# Patient Record
Sex: Male | Born: 1955 | Race: White | Hispanic: No | Marital: Married | Smoking: Current every day smoker
Health system: Southern US, Community
[De-identification: ages and names within clinical notes are randomized; demographics above are authoritative.]

## PROBLEM LIST (undated history)

## (undated) DIAGNOSIS — K219 Gastro-esophageal reflux disease without esophagitis: Secondary | ICD-10-CM

## (undated) DIAGNOSIS — I10 Essential (primary) hypertension: Secondary | ICD-10-CM

## (undated) HISTORY — DX: Essential (primary) hypertension: I10

## (undated) HISTORY — DX: Gastro-esophageal reflux disease without esophagitis: K21.9

## (undated) HISTORY — PX: APPENDECTOMY: SHX54

---

## 2010-09-06 ENCOUNTER — Ambulatory Visit: Payer: Self-pay | Admitting: Ophthalmology

## 2010-09-30 ENCOUNTER — Ambulatory Visit: Payer: Self-pay | Admitting: Ophthalmology

## 2013-12-09 LAB — PSA: PSA: 1

## 2014-07-28 LAB — BASIC METABOLIC PANEL
CREATININE: 0.9 mg/dL (ref <=1.3)
GLUCOSE: 97 mg/dL

## 2014-07-28 LAB — LIPID PANEL
CHOLESTEROL: 158 mg/dL (ref 0–200)
HDL: 37 mg/dL (ref 35–70)
LDL Cholesterol: 93 mg/dL
TRIGLYCERIDES: 141 mg/dL (ref 40–160)

## 2014-08-09 DIAGNOSIS — N4289 Other specified disorders of prostate: Secondary | ICD-10-CM | POA: Insufficient documentation

## 2014-08-09 DIAGNOSIS — M436 Torticollis: Secondary | ICD-10-CM | POA: Insufficient documentation

## 2014-08-09 DIAGNOSIS — K219 Gastro-esophageal reflux disease without esophagitis: Secondary | ICD-10-CM | POA: Insufficient documentation

## 2014-08-09 DIAGNOSIS — R030 Elevated blood-pressure reading, without diagnosis of hypertension: Secondary | ICD-10-CM | POA: Insufficient documentation

## 2014-08-09 DIAGNOSIS — Z0000 Encounter for general adult medical examination without abnormal findings: Secondary | ICD-10-CM | POA: Insufficient documentation

## 2015-02-12 ENCOUNTER — Other Ambulatory Visit: Payer: Self-pay | Admitting: Family Medicine

## 2015-05-18 ENCOUNTER — Other Ambulatory Visit: Payer: Self-pay

## 2015-05-19 ENCOUNTER — Other Ambulatory Visit: Payer: Self-pay | Admitting: Family Medicine

## 2015-05-25 ENCOUNTER — Ambulatory Visit (INDEPENDENT_AMBULATORY_CARE_PROVIDER_SITE_OTHER): Payer: BLUE CROSS/BLUE SHIELD | Admitting: Family Medicine

## 2015-05-25 ENCOUNTER — Encounter: Payer: Self-pay | Admitting: Family Medicine

## 2015-05-25 VITALS — BP 130/64 | HR 80 | Ht 71.0 in | Wt 166.0 lb

## 2015-05-25 DIAGNOSIS — Z1211 Encounter for screening for malignant neoplasm of colon: Secondary | ICD-10-CM

## 2015-05-25 DIAGNOSIS — K219 Gastro-esophageal reflux disease without esophagitis: Secondary | ICD-10-CM | POA: Diagnosis not present

## 2015-05-25 DIAGNOSIS — Z1159 Encounter for screening for other viral diseases: Secondary | ICD-10-CM | POA: Diagnosis not present

## 2015-05-25 DIAGNOSIS — R030 Elevated blood-pressure reading, without diagnosis of hypertension: Secondary | ICD-10-CM

## 2015-05-25 MED ORDER — LISINOPRIL-HYDROCHLOROTHIAZIDE 10-12.5 MG PO TABS: 1.0000 | ORAL_TABLET | Freq: Every day | ORAL | Status: DC

## 2015-05-25 MED ORDER — OMEPRAZOLE 20 MG PO CPDR: 20.0000 mg | DELAYED_RELEASE_CAPSULE | Freq: Every day | ORAL | Status: DC

## 2015-05-25 NOTE — Progress Notes (Signed)
Name: Bruce Kelley   MRN: [id number]    DOB: July 11, 1955   Date:05/25/2015       Progress Note  Subjective  Chief Complaint  Chief Complaint  Patient presents with  . Hypertension  . Gastroesophageal Reflux    Hypertension This is a chronic problem. The current episode started more than 1 year ago. The problem has been gradually improving since onset. The problem is uncontrolled. Pertinent negatives include no anxiety, blurred vision, chest pain, headaches, malaise/fatigue, neck pain, orthopnea, palpitations, peripheral edema, PND, shortness of breath or sweats. There are no associated agents to hypertension. Risk factors for coronary artery disease include smoking/tobacco exposure and male gender. Past treatments include ACE inhibitors and diuretics. The current treatment provides no improvement. There are no compliance problems.  There is no history of angina, kidney disease, CAD/MI, CVA, heart failure, left ventricular hypertrophy, PVD, renovascular disease or retinopathy. There is no history of chronic renal disease or a hypertension causing med.  Gastroesophageal Reflux He reports no abdominal pain, no chest pain, no coughing, no heartburn, no nausea, no sore throat or no wheezing. Pertinent negatives include no melena or weight loss.    No problem-specific assessment & plan notes found for this encounter.   Past Medical History  Diagnosis Date  . Hypertension   . GERD (gastroesophageal reflux disease)     Past Surgical History  Procedure Laterality Date  . Appendectomy      History reviewed. No pertinent family history.  Social History   Social History  . Marital Status: Married    Spouse Name: N/A  . Number of Children: N/A  . Years of Education: N/A   Occupational History  . Not on file.   Social History Main Topics  . Smoking status: Current Every Day Smoker  . Smokeless tobacco: Not on file  . Alcohol Use: 0.0 oz/week    0 Standard drinks or equivalent  per week  . Drug Use: No  . Sexual Activity: Yes   Other Topics Concern  . Not on file   Social History Narrative    No Known Allergies   Review of Systems  Constitutional: Negative for fever, chills, weight loss and malaise/fatigue.  HENT: Negative for ear discharge, ear pain and sore throat.   Eyes: Negative for blurred vision.  Respiratory: Negative for cough, sputum production, shortness of breath and wheezing.   Cardiovascular: Negative for chest pain, palpitations, orthopnea, leg swelling and PND.  Gastrointestinal: Negative for heartburn, nausea, abdominal pain, diarrhea, constipation, blood in stool and melena.  Genitourinary: Negative for dysuria, urgency, frequency and hematuria.  Musculoskeletal: Negative for myalgias, back pain, joint pain and neck pain.  Skin: Negative for rash.  Neurological: Negative for dizziness, tingling, sensory change, focal weakness and headaches.  Endo/Heme/Allergies: Negative for environmental allergies and polydipsia. Does not bruise/bleed easily.  Psychiatric/Behavioral: Negative for depression and suicidal ideas. The patient is not nervous/anxious and does not have insomnia.      Objective  Filed Vitals:   05/25/15 1011  BP: 130/64  Pulse: 80  Height: 5\' 11"  (1.803 m)  Weight: 166 lb (75.297 kg)    Physical Exam  Constitutional: He is oriented to person, place, and time and well-developed, well-nourished, and in no distress.  HENT:  Head: Normocephalic.  Right Ear: External ear normal.  Left Ear: External ear normal.  Nose: Nose normal.  Mouth/Throat: Oropharynx is clear and moist.  Eyes: Conjunctivae and EOM are normal. Pupils are equal, round, and reactive to light. Right  eye exhibits no discharge. Left eye exhibits no discharge. No scleral icterus.  Neck: Normal range of motion. Neck supple. No JVD present. No tracheal deviation present. No thyromegaly present.  Cardiovascular: Normal rate, regular rhythm, normal heart  sounds and intact distal pulses.  Exam reveals no gallop and no friction rub.   No murmur heard. Pulmonary/Chest: Breath sounds normal. No respiratory distress. He has no wheezes. He has no rales.  Abdominal: Soft. Bowel sounds are normal. He exhibits no mass. There is no hepatosplenomegaly. There is no tenderness. There is no rebound, no guarding and no CVA tenderness.  Genitourinary: Rectum normal and prostate normal.  Musculoskeletal: Normal range of motion. He exhibits no edema or tenderness.  Lymphadenopathy:    He has no cervical adenopathy.  Neurological: He is alert and oriented to person, place, and time. He has normal sensation, normal strength, normal reflexes and intact cranial nerves. No cranial nerve deficit.  Skin: Skin is warm. No rash noted.  Psychiatric: Mood and affect normal.      Assessment & Plan  Problem List Items Addressed This Visit      Cardiovascular and Mediastinum   Elevated blood pressure, situational - Primary   Relevant Medications   lisinopril-hydrochlorothiazide (PRINZIDE,ZESTORETIC) 10-12.5 MG tablet   Other Relevant Orders   Renal Function Panel     Digestive   Acid reflux   Relevant Medications   omeprazole (PRILOSEC) 20 MG capsule    Other Visit Diagnoses    Colon cancer screening        Relevant Medications    omeprazole (PRILOSEC) 20 MG capsule    Need for hepatitis C screening test        Relevant Orders    Hepatitis C antibody         Dr. Deanna [facility]  05/25/2015

## 2015-05-26 LAB — RENAL FUNCTION PANEL
Albumin: 4.1 g/dL (ref 3.5–5.5)
BUN / CREAT RATIO: 14 (ref 9–20)
BUN: 13 mg/dL (ref 6–24)
CALCIUM: 8.9 mg/dL (ref 8.7–10.2)
CO2: 25 mmol/L (ref 18–29)
CREATININE: 0.95 mg/dL (ref 0.76–1.27)
Chloride: 100 mmol/L (ref 96–106)
GFR, EST AFRICAN AMERICAN: 101 mL/min/{1.73_m2} (ref >59)
GFR, EST NON AFRICAN AMERICAN: 87 mL/min/{1.73_m2} (ref >59)
GLUCOSE: 83 mg/dL (ref 65–99)
Phosphorus: 3.3 mg/dL (ref 2.5–4.5)
Potassium: 4.1 mmol/L (ref 3.5–5.2)
SODIUM: 142 mmol/L (ref 134–144)

## 2015-05-26 LAB — HEPATITIS C ANTIBODY

## 2015-08-13 ENCOUNTER — Other Ambulatory Visit: Payer: Self-pay | Admitting: Family Medicine

## 2016-03-03 ENCOUNTER — Other Ambulatory Visit: Payer: Self-pay | Admitting: Family Medicine

## 2016-03-03 DIAGNOSIS — R030 Elevated blood-pressure reading, without diagnosis of hypertension: Secondary | ICD-10-CM

## 2016-04-02 ENCOUNTER — Other Ambulatory Visit: Payer: Self-pay | Admitting: Family Medicine

## 2016-04-02 DIAGNOSIS — R030 Elevated blood-pressure reading, without diagnosis of hypertension: Secondary | ICD-10-CM

## 2016-04-15 ENCOUNTER — Ambulatory Visit: Payer: BLUE CROSS/BLUE SHIELD | Admitting: Family Medicine

## 2016-04-17 ENCOUNTER — Ambulatory Visit: Payer: BLUE CROSS/BLUE SHIELD | Admitting: Family Medicine

## 2016-04-18 ENCOUNTER — Ambulatory Visit (INDEPENDENT_AMBULATORY_CARE_PROVIDER_SITE_OTHER): Payer: BLUE CROSS/BLUE SHIELD | Admitting: Family Medicine

## 2016-04-18 ENCOUNTER — Encounter: Payer: Self-pay | Admitting: Family Medicine

## 2016-04-18 VITALS — BP 130/80 | HR 88 | Ht 71.0 in | Wt 168.0 lb

## 2016-04-18 DIAGNOSIS — R030 Elevated blood-pressure reading, without diagnosis of hypertension: Secondary | ICD-10-CM | POA: Diagnosis not present

## 2016-04-18 DIAGNOSIS — K219 Gastro-esophageal reflux disease without esophagitis: Secondary | ICD-10-CM

## 2016-04-18 DIAGNOSIS — I10 Essential (primary) hypertension: Secondary | ICD-10-CM

## 2016-04-18 DIAGNOSIS — Z23 Encounter for immunization: Secondary | ICD-10-CM

## 2016-04-18 MED ORDER — OMEPRAZOLE 20 MG PO CPDR: 20.0000 mg | DELAYED_RELEASE_CAPSULE | Freq: Every day | ORAL | 3 refills | Status: DC

## 2016-04-18 MED ORDER — LISINOPRIL-HYDROCHLOROTHIAZIDE 10-12.5 MG PO TABS: ORAL_TABLET | ORAL | 3 refills | Status: DC

## 2016-04-18 NOTE — Progress Notes (Signed)
Name: Bruce Kelley   MRN: [id number]    DOB: 10/15/1955   Date:04/18/2016       Progress Note  Subjective  Chief Complaint  Chief Complaint  Patient presents with  . Hypertension  . Gastroesophageal Reflux    Hypertension  This is a chronic problem. The current episode started more than 1 year ago. The problem has been gradually improving since onset. The problem is controlled. Pertinent negatives include no anxiety, blurred vision, chest pain, headaches, malaise/fatigue, neck pain, orthopnea, palpitations, peripheral edema, PND, shortness of breath or sweats. There are no associated agents to hypertension. Risk factors for coronary artery disease include smoking/tobacco exposure and stress. Past treatments include ACE inhibitors and diuretics. The current treatment provides moderate improvement. There are no compliance problems.  There is no history of angina, kidney disease, CAD/MI, CVA, heart failure, left ventricular hypertrophy, PVD, renovascular disease or retinopathy. There is no history of chronic renal disease or a hypertension causing med.  Gastroesophageal Reflux  He reports no abdominal pain, no belching, no chest pain, no choking, no coughing, no dysphagia, no early satiety, no globus sensation, no heartburn, no hoarse voice, no nausea, no sore throat, no stridor, no tooth decay, no water brash or no wheezing. This is a recurrent problem. The problem occurs occasionally. The problem has been waxing and waning. The symptoms are aggravated by certain foods. Pertinent negatives include no anemia, fatigue, melena, muscle weakness, orthopnea or weight loss. He has tried a PPI for the symptoms.    No problem-specific Assessment & Plan notes found for this encounter.   Past Medical History:  Diagnosis Date  . GERD (gastroesophageal reflux disease)   . Hypertension     Past Surgical History:  Procedure Laterality Date  . APPENDECTOMY      No family history on file.  Social  History   Social History  . Marital status: Married    Spouse name: N/A  . Number of children: N/A  . Years of education: N/A   Occupational History  . Not on file.   Social History Main Topics  . Smoking status: Current Every Day Smoker  . Smokeless tobacco: Not on file  . Alcohol use 0.0 oz/week  . Drug use: No  . Sexual activity: Yes   Other Topics Concern  . Not on file   Social History Narrative  . No narrative on file    No Known Allergies   Review of Systems  Constitutional: Negative for chills, fatigue, fever, malaise/fatigue and weight loss.  HENT: Negative for ear discharge, ear pain, hoarse voice and sore throat.   Eyes: Negative for blurred vision.  Respiratory: Negative for cough, sputum production, choking, shortness of breath and wheezing.   Cardiovascular: Negative for chest pain, palpitations, orthopnea, leg swelling and PND.  Gastrointestinal: Negative for abdominal pain, blood in stool, constipation, diarrhea, dysphagia, heartburn, melena and nausea.  Genitourinary: Negative for dysuria, frequency, hematuria and urgency.  Musculoskeletal: Negative for back pain, joint pain, myalgias, muscle weakness and neck pain.  Skin: Negative for rash.  Neurological: Negative for dizziness, tingling, sensory change, focal weakness and headaches.  Endo/Heme/Allergies: Negative for environmental allergies and polydipsia. Does not bruise/bleed easily.  Psychiatric/Behavioral: Negative for depression and suicidal ideas. The patient is not nervous/anxious and does not have insomnia.      Objective  Vitals:   04/18/16 1004  BP: 130/80  Pulse: 88  Weight: 168 lb (76.2 kg)  Height: 5\' 11"  (1.803 m)    Physical Exam  Constitutional: He is oriented to person, place, and time and well-developed, well-nourished, and in no distress.  HENT:  Head: Normocephalic.  Right Ear: External ear normal.  Left Ear: External ear normal.  Nose: Nose normal.  Mouth/Throat:  Oropharynx is clear and moist.  Eyes: Conjunctivae and EOM are normal. Pupils are equal, round, and reactive to light. Right eye exhibits no discharge. Left eye exhibits no discharge. No scleral icterus.  Neck: Normal range of motion. Neck supple. No JVD present. No tracheal deviation present. No thyromegaly present.  Cardiovascular: Normal rate, regular rhythm, normal heart sounds and intact distal pulses.  Exam reveals no gallop and no friction rub.   No murmur heard. Pulmonary/Chest: Breath sounds normal. No respiratory distress. He has no wheezes. He has no rales.  Abdominal: Soft. Bowel sounds are normal. He exhibits no mass. There is no hepatosplenomegaly. There is no tenderness. There is no rebound, no guarding and no CVA tenderness.  Musculoskeletal: Normal range of motion. He exhibits no edema or tenderness.  Lymphadenopathy:    He has no cervical adenopathy.  Neurological: He is alert and oriented to person, place, and time. He has normal sensation, normal strength, normal reflexes and intact cranial nerves. No cranial nerve deficit.  Skin: Skin is warm. No rash noted.  Psychiatric: Mood and affect normal.  Nursing note and vitals reviewed.     Assessment & Plan  Problem List Items Addressed This Visit      Cardiovascular and Mediastinum   Elevated blood pressure, situational   Relevant Medications   lisinopril-hydrochlorothiazide (PRINZIDE,ZESTORETIC) 10-12.5 MG tablet     Digestive   Acid reflux   Relevant Medications   omeprazole (PRILOSEC) 20 MG capsule    Other Visit Diagnoses    Essential hypertension    -  Primary   Relevant Medications   lisinopril-hydrochlorothiazide (PRINZIDE,ZESTORETIC) 10-12.5 MG tablet   Flu vaccine need       Relevant Orders   Flu Vaccine QUAD 36+ mos PF IM (Fluarix & Fluzone Quad PF) (Completed)        Dr. Xzaviar Maloof [facility]  04/18/16

## 2017-07-05 ENCOUNTER — Other Ambulatory Visit: Payer: Self-pay | Admitting: Family Medicine

## 2017-07-05 DIAGNOSIS — I10 Essential (primary) hypertension: Secondary | ICD-10-CM

## 2017-07-05 DIAGNOSIS — R030 Elevated blood-pressure reading, without diagnosis of hypertension: Secondary | ICD-10-CM

## 2017-08-04 ENCOUNTER — Ambulatory Visit
Admission: RE | Admit: 2017-08-04 | Discharge: 2017-08-04 | Disposition: A | Discharge status: Home / Self Care | Payer: BLUE CROSS/BLUE SHIELD | Source: Ambulatory Visit | Attending: Family Medicine | Admitting: Family Medicine

## 2017-08-04 ENCOUNTER — Ambulatory Visit: Payer: BLUE CROSS/BLUE SHIELD | Admitting: Family Medicine

## 2017-08-04 ENCOUNTER — Encounter: Payer: Self-pay | Admitting: Family Medicine

## 2017-08-04 VITALS — BP 122/80 | HR 88 | Ht 71.0 in | Wt 160.0 lb

## 2017-08-04 DIAGNOSIS — R05 Cough: Secondary | ICD-10-CM | POA: Diagnosis not present

## 2017-08-04 DIAGNOSIS — F17200 Nicotine dependence, unspecified, uncomplicated: Secondary | ICD-10-CM | POA: Diagnosis not present

## 2017-08-04 DIAGNOSIS — R634 Abnormal weight loss: Secondary | ICD-10-CM | POA: Diagnosis present

## 2017-08-04 DIAGNOSIS — R030 Elevated blood-pressure reading, without diagnosis of hypertension: Secondary | ICD-10-CM | POA: Diagnosis not present

## 2017-08-04 DIAGNOSIS — R053 Chronic cough: Secondary | ICD-10-CM

## 2017-08-04 DIAGNOSIS — R918 Other nonspecific abnormal finding of lung field: Secondary | ICD-10-CM | POA: Diagnosis not present

## 2017-08-04 DIAGNOSIS — K219 Gastro-esophageal reflux disease without esophagitis: Secondary | ICD-10-CM | POA: Diagnosis not present

## 2017-08-04 DIAGNOSIS — W57XXXA Bitten or stung by nonvenomous insect and other nonvenomous arthropods, initial encounter: Secondary | ICD-10-CM

## 2017-08-04 DIAGNOSIS — Z23 Encounter for immunization: Secondary | ICD-10-CM | POA: Diagnosis not present

## 2017-08-04 DIAGNOSIS — Z1211 Encounter for screening for malignant neoplasm of colon: Secondary | ICD-10-CM | POA: Diagnosis not present

## 2017-08-04 LAB — HEMOCCULT GUIAC POC 1CARD (OFFICE): Fecal Occult Blood, POC: NEGATIVE

## 2017-08-04 MED ORDER — DOXYCYCLINE HYCLATE 100 MG PO TABS: 100.0000 mg | ORAL_TABLET | Freq: Two times a day (BID) | ORAL | 0 refills | Status: DC

## 2017-08-04 MED ORDER — PANTOPRAZOLE SODIUM 40 MG PO TBEC: 40.0000 mg | DELAYED_RELEASE_TABLET | Freq: Every day | ORAL | 1 refills | Status: DC

## 2017-08-04 MED ORDER — LOSARTAN POTASSIUM-HCTZ 50-12.5 MG PO TABS
1.0000 | ORAL_TABLET | Freq: Every day | ORAL | 1 refills | Status: DC
Start: 2017-08-04 — End: 2017-08-18

## 2017-08-04 NOTE — Progress Notes (Signed)
Name: Bruce Kelley   MRN: [id number]    DOB: [birth year]   Date:08/04/2017       Progress Note  Subjective  Chief Complaint  Chief Complaint  Patient presents with  . Hypertension  . Gastroesophageal Reflux    wants to change to something different to see if ins will pay    Hypertension  This is a chronic problem. The current episode started more than 1 year ago. The problem has been gradually improving since onset. The problem is controlled. Pertinent negatives include no anxiety, blurred vision, chest pain, headaches, malaise/fatigue, neck pain, orthopnea, palpitations, peripheral edema, PND or shortness of breath. There are no associated agents to hypertension. There are no known risk factors for coronary artery disease. Past treatments include ACE inhibitors and diuretics. The current treatment provides moderate improvement. There are no compliance problems.  There is no history of kidney disease, CAD/MI, CVA, heart failure, left ventricular hypertrophy, PVD or retinopathy. There is no history of chronic renal disease, a hypertension causing med or renovascular disease.  Gastroesophageal Reflux  He reports no abdominal pain, no belching, no chest pain, no choking, no coughing, no dysphagia, no early satiety, no globus sensation, no heartburn, no hoarse voice, no nausea, no sore throat or no wheezing. This is a chronic problem. The problem has been waxing and waning. The symptoms are aggravated by certain foods. Associated symptoms include weight loss. Pertinent negatives include no fatigue, melena, muscle weakness or orthopnea. He has tried a PPI for the symptoms.  Cough  This is a chronic problem. The current episode started more than 1 year ago. The problem has been unchanged. The cough is non-productive. Associated symptoms include weight loss. Pertinent negatives include no chest pain, chills, ear pain, eye redness, fever, headaches, heartburn, hemoptysis, myalgias, rash, sore throat,  shortness of breath or wheezing. Nothing aggravates the symptoms. He has tried nothing for the symptoms. There is no history of environmental allergies.    No problem-specific Assessment & Plan notes found for this encounter.   Past Medical History:  Diagnosis Date  . GERD (gastroesophageal reflux disease)   . Hypertension     Past Surgical History:  Procedure Laterality Date  . APPENDECTOMY      No family history on file.  Social History   Socioeconomic History  . Marital status: Married    Spouse name: Not on file  . Number of children: Not on file  . Years of education: Not on file  . Highest education level: Not on file  Occupational History  . Not on file  Social Needs  . Financial resource strain: Not on file  . Food insecurity:    Worry: Not on file    Inability: Not on file  . Transportation needs:    Medical: Not on file    Non-medical: Not on file  Tobacco Use  . Smoking status: Current Every Day Smoker  . Smokeless tobacco: Never Used  . Tobacco comment: gave info about ZYban and patches  Substance and Sexual Activity  . Alcohol use: Yes    Alcohol/week: 0.0 oz  . Drug use: No  . Sexual activity: Yes  Lifestyle  . Physical activity:    Days per week: Not on file    Minutes per session: Not on file  . Stress: Not on file  Relationships  . Social connections:    Talks on phone: Not on file    Gets together: Not on file    Attends religious service:  Not on file    Active member of club or organization: Not on file    Attends meetings of clubs or organizations: Not on file    Relationship status: Not on file  . Intimate partner violence:    Fear of current or ex partner: Not on file    Emotionally abused: Not on file    Physically abused: Not on file    Forced sexual activity: Not on file  Other Topics Concern  . Not on file  Social History Narrative  . Not on file    No Known Allergies  Outpatient Medications Prior to Visit  Medication  Sig Dispense Refill  . lisinopril-hydrochlorothiazide (PRINZIDE,ZESTORETIC) 10-12.5 MG tablet TAKE 1 TABLET BY MOUTH DAILY. NEEDS APT FOR REFILLS 15 tablet 0  . omeprazole (PRILOSEC) 20 MG capsule Take 1 capsule (20 mg total) by mouth daily. (Patient taking differently: Take 20 mg by mouth daily. otc) 90 capsule 3   No facility-administered medications prior to visit.     Review of Systems  Constitutional: Positive for weight loss. Negative for chills, diaphoresis, fatigue, fever and malaise/fatigue.  HENT: Negative for congestion, ear discharge, ear pain, hearing loss, hoarse voice, nosebleeds, sinus pain, sore throat and tinnitus.   Eyes: Negative for blurred vision, double vision, photophobia, pain, discharge and redness.  Respiratory: Negative for cough, hemoptysis, sputum production, choking, shortness of breath, wheezing and stridor.   Cardiovascular: Negative for chest pain, palpitations, orthopnea, claudication, leg swelling and PND.  Gastrointestinal: Negative for abdominal pain, blood in stool, constipation, diarrhea, dysphagia, heartburn, melena, nausea and vomiting.  Genitourinary: Negative for dysuria, flank pain, frequency, hematuria and urgency.       Incomplete emptyong/nocturia  Musculoskeletal: Negative for back pain, falls, joint pain, myalgias, muscle weakness and neck pain.  Skin: Negative for itching and rash.  Neurological: Negative for dizziness, tingling, tremors, sensory change, speech change, focal weakness, seizures, loss of consciousness, weakness and headaches.  Endo/Heme/Allergies: Negative for environmental allergies and polydipsia. Does not bruise/bleed easily.  Psychiatric/Behavioral: Negative for depression and suicidal ideas. The patient is not nervous/anxious and does not have insomnia.      Objective  Vitals:   08/04/17 0903  BP: 122/80  Pulse: 88  Weight: 160 lb (72.6 kg)  Height: 5\' 11"  (1.803 m)    Physical Exam  Constitutional: He is  oriented to person, place, and time. He appears well-nourished.  HENT:  Head: Normocephalic.  Right Ear: Tympanic membrane and external ear normal.  Left Ear: Tympanic membrane and external ear normal.  Nose: Nose normal.  Mouth/Throat: Oropharynx is clear and moist. No oropharyngeal exudate, posterior oropharyngeal edema or posterior oropharyngeal erythema.  Eyes: Pupils are equal, round, and reactive to light. Conjunctivae and EOM are normal. Right eye exhibits no discharge. Left eye exhibits no discharge. No scleral icterus.  Neck: Normal range of motion. Neck supple. Normal carotid pulses, no hepatojugular reflux and no JVD present. No tracheal tenderness present. Carotid bruit is not present. No tracheal deviation present. No thyromegaly present.  Cardiovascular: Normal rate, regular rhythm, normal heart sounds and intact distal pulses. PMI is not displaced. Exam reveals no gallop, no S3, no S4 and no friction rub.  No murmur heard. Pulmonary/Chest: Effort normal and breath sounds normal. No respiratory distress. He has no wheezes. He has no rales.  Abdominal: Soft. Normal aorta and bowel sounds are normal. He exhibits no distension and no mass. There is no hepatosplenomegaly. There is no tenderness. There is no rebound, no guarding and  no CVA tenderness.  Genitourinary: Testes normal. No penile erythema.  Musculoskeletal: Normal range of motion. He exhibits no edema or tenderness.  Lymphadenopathy:       Head (right side): No submental and no submandibular adenopathy present.       Head (left side): No submental and no submandibular adenopathy present.    He has no cervical adenopathy.       Right cervical: No superficial cervical adenopathy present.      Left cervical: No superficial cervical adenopathy present.       Right: Inguinal adenopathy present.  Neurological: He is alert and oriented to person, place, and time. He has normal strength and normal reflexes. No cranial nerve  deficit.  Skin: Skin is warm. Ecchymosis noted. No rash noted. There is erythema.     ?tick /with ensuing rash/tender lymph node  Nursing note and vitals reviewed.     Assessment & Plan  Problem List Items Addressed This Visit      Cardiovascular and Mediastinum   Elevated blood pressure, situational   Relevant Medications   losartan-hydrochlorothiazide (HYZAAR) 50-12.5 MG tablet   Other Relevant Orders   Renal function panel     Digestive   Acid reflux   Relevant Medications   pantoprazole (PROTONIX) 40 MG tablet    Other Visit Diagnoses    Weight loss    -  Primary   Relevant Orders   TSH   CBC   Tobacco dependence       Chronic cough       Relevant Orders   DG Chest 2 View   Tick bite, initial encounter       Relevant Medications   doxycycline (VIBRA-TABS) 100 MG tablet   Other Relevant Orders   Tdap vaccine greater than or equal to 7yo IM (Completed)   Need for diphtheria-tetanus-pertussis (Tdap) vaccine       Relevant Orders   Tdap vaccine greater than or equal to 7yo IM (Completed)   Colon cancer screening       Relevant Orders   Ambulatory referral to Gastroenterology   POCT occult blood stool (Completed)      Meds ordered this encounter  Medications  . losartan-hydrochlorothiazide (HYZAAR) 50-12.5 MG tablet    Sig: Take 1 tablet by mouth daily.    Dispense:  90 tablet    Refill:  1  . pantoprazole (PROTONIX) 40 MG tablet    Sig: Take 1 tablet (40 mg total) by mouth daily.    Dispense:  90 tablet    Refill:  1  . doxycycline (VIBRA-TABS) 100 MG tablet    Sig: Take 1 tablet (100 mg total) by mouth 2 (two) times daily.    Dispense:  20 tablet    Refill:  0      Dr. [facility]  08/04/17

## 2017-08-05 LAB — CBC
HEMOGLOBIN: 15.9 g/dL (ref 13.0–17.7)
Hematocrit: 46 % (ref 37.5–51.0)
MCH: 32.1 pg (ref 26.6–33.0)
MCHC: 34.6 g/dL (ref 31.5–35.7)
MCV: 93 fL (ref 79–97)
Platelets: 283 10*3/uL (ref 150–379)
RBC: 4.95 x10E6/uL (ref 4.14–5.80)
RDW: 14 % (ref 12.3–15.4)
WBC: 11.6 10*3/uL — ABNORMAL HIGH (ref 3.4–10.8)

## 2017-08-05 LAB — RENAL FUNCTION PANEL
ALBUMIN: 4.2 g/dL (ref 3.6–4.8)
BUN/Creatinine Ratio: 14 (ref 10–24)
BUN: 12 mg/dL (ref 8–27)
CHLORIDE: 100 mmol/L (ref 96–106)
CO2: 25 mmol/L (ref 20–29)
Calcium: 9.2 mg/dL (ref 8.6–10.2)
Creatinine, Ser: 0.85 mg/dL (ref 0.76–1.27)
GFR calc Af Amer: 109 mL/min/{1.73_m2} (ref >59)
GFR calc non Af Amer: 94 mL/min/{1.73_m2} (ref >59)
Glucose: 83 mg/dL (ref 65–99)
Phosphorus: 3.4 mg/dL (ref 2.5–4.5)
Potassium: 3.7 mmol/L (ref 3.5–5.2)
Sodium: 142 mmol/L (ref 134–144)

## 2017-08-05 LAB — TSH: TSH: 1.46 u[IU]/mL (ref 0.450–4.500)

## 2017-08-07 ENCOUNTER — Other Ambulatory Visit: Payer: Self-pay

## 2017-08-07 DIAGNOSIS — Z1211 Encounter for screening for malignant neoplasm of colon: Secondary | ICD-10-CM

## 2017-08-07 MED ORDER — PEG 3350-KCL-NA BICARB-NACL 420 G PO SOLR: 4000.0000 mL | Freq: Once | ORAL | 0 refills | Status: AC

## 2017-08-07 NOTE — Progress Notes (Unsigned)
Gastroenterology Pre-Procedure Review  Request Date: 6/26 Requesting Physician: Dr. Allen Norris  PATIENT REVIEW QUESTIONS: The patient responded to the following health history questions as indicated:    1. Are you having any GI issues? no 2. Do you have a personal history of Polyps? no 3. Do you have a family history of Colon Cancer or Polyps? no 4. Diabetes Mellitus? no 5. Joint replacements in the past 12 months?no 6. Major health problems in the past 3 months?no 7. Any artificial heart valves, MVP, or defibrillator?no    MEDICATIONS & ALLERGIES:    Patient reports the following regarding taking any anticoagulation/antiplatelet therapy:   Plavix, Coumadin, Eliquis, Xarelto, Lovenox, Pradaxa, Brilinta, or Effient? no Aspirin? no  Patient confirms/reports the following medications:  Current Outpatient Medications  Medication Sig Dispense Refill  . doxycycline (VIBRA-TABS) 100 MG tablet Take 1 tablet (100 mg total) by mouth 2 (two) times daily. 20 tablet 0  . losartan-hydrochlorothiazide (HYZAAR) 50-12.5 MG tablet Take 1 tablet by mouth daily. 90 tablet 1  . pantoprazole (PROTONIX) 40 MG tablet Take 1 tablet (40 mg total) by mouth daily. 90 tablet 1   No current facility-administered medications for this visit.     Patient confirms/reports the following allergies:  No Known Allergies  No orders of the defined types were placed in this encounter.   AUTHORIZATION INFORMATION Primary Insurance: 1D#: Group #:  Secondary Insurance: 1D#: Group #:  SCHEDULE INFORMATION: Date:  Time: Location:

## 2017-08-15 ENCOUNTER — Other Ambulatory Visit: Payer: Self-pay | Admitting: Family Medicine

## 2017-08-15 DIAGNOSIS — I10 Essential (primary) hypertension: Secondary | ICD-10-CM

## 2017-08-15 DIAGNOSIS — R030 Elevated blood-pressure reading, without diagnosis of hypertension: Secondary | ICD-10-CM

## 2017-08-18 ENCOUNTER — Other Ambulatory Visit: Payer: Self-pay

## 2017-08-18 MED ORDER — LOSARTAN POTASSIUM 50 MG PO TABS: 50.0000 mg | ORAL_TABLET | Freq: Every day | ORAL | 1 refills | Status: DC

## 2017-08-18 MED ORDER — HYDROCHLOROTHIAZIDE 12.5 MG PO CAPS: 12.5000 mg | ORAL_CAPSULE | Freq: Every day | ORAL | 1 refills | Status: DC

## 2017-09-24 ENCOUNTER — Other Ambulatory Visit: Payer: Self-pay

## 2017-09-30 ENCOUNTER — Encounter
Admission: RE | Disposition: A | Discharge status: Home / Self Care | Payer: Self-pay | Source: Ambulatory Visit | Attending: Gastroenterology

## 2017-09-30 ENCOUNTER — Ambulatory Visit
Admission: RE | Admit: 2017-09-30 | Discharge: 2017-09-30 | Disposition: A | Discharge status: Home / Self Care | Payer: BLUE CROSS/BLUE SHIELD | Source: Ambulatory Visit | Attending: Gastroenterology | Admitting: Gastroenterology

## 2017-09-30 ENCOUNTER — Ambulatory Visit: Payer: BLUE CROSS/BLUE SHIELD | Admitting: Anesthesiology

## 2017-09-30 DIAGNOSIS — Z79899 Other long term (current) drug therapy: Secondary | ICD-10-CM | POA: Diagnosis not present

## 2017-09-30 DIAGNOSIS — D122 Benign neoplasm of ascending colon: Secondary | ICD-10-CM | POA: Diagnosis not present

## 2017-09-30 DIAGNOSIS — K219 Gastro-esophageal reflux disease without esophagitis: Secondary | ICD-10-CM | POA: Diagnosis not present

## 2017-09-30 DIAGNOSIS — F17210 Nicotine dependence, cigarettes, uncomplicated: Secondary | ICD-10-CM | POA: Insufficient documentation

## 2017-09-30 DIAGNOSIS — D124 Benign neoplasm of descending colon: Secondary | ICD-10-CM | POA: Diagnosis not present

## 2017-09-30 DIAGNOSIS — Z1211 Encounter for screening for malignant neoplasm of colon: Secondary | ICD-10-CM

## 2017-09-30 DIAGNOSIS — I10 Essential (primary) hypertension: Secondary | ICD-10-CM | POA: Diagnosis not present

## 2017-09-30 DIAGNOSIS — K621 Rectal polyp: Secondary | ICD-10-CM

## 2017-09-30 HISTORY — PX: COLONOSCOPY WITH PROPOFOL: SHX5780

## 2017-09-30 SURGERY — COLONOSCOPY WITH PROPOFOL
Anesthesia: General | Wound class: Clean Contaminated

## 2017-09-30 MED ORDER — LIDOCAINE HCL (CARDIAC) PF 100 MG/5ML IV SOSY
PREFILLED_SYRINGE | INTRAVENOUS | Status: DC | PRN
  Administered 2017-09-30: 100 mg via INTRAVENOUS

## 2017-09-30 MED ORDER — ONDANSETRON HCL 4 MG/2ML IJ SOLN: 4.0000 mg | Freq: Once | INTRAMUSCULAR | Status: DC | PRN

## 2017-09-30 MED ORDER — SIMETHICONE 40 MG/0.6ML PO SUSP
ORAL | Status: DC | PRN
  Administered 2017-09-30: 08:00:00

## 2017-09-30 MED ORDER — SODIUM CHLORIDE 0.9 % IV SOLN: INTRAVENOUS | Status: DC

## 2017-09-30 MED ORDER — PROPOFOL 10 MG/ML IV BOLUS
INTRAVENOUS | Status: DC | PRN
  Administered 2017-09-30: 40 mg via INTRAVENOUS
  Administered 2017-09-30: 20 mg via INTRAVENOUS
  Administered 2017-09-30 (×2): 40 mg via INTRAVENOUS
  Administered 2017-09-30: 70 mg via INTRAVENOUS
  Administered 2017-09-30: 40 mg via INTRAVENOUS

## 2017-09-30 MED ORDER — LACTATED RINGERS IV SOLN
10.0000 mL/h | INTRAVENOUS | Status: DC
  Administered 2017-09-30: 10 mL/h via INTRAVENOUS
  Administered 2017-09-30 (×2): via INTRAVENOUS

## 2017-09-30 SURGICAL SUPPLY — 25 items
CANISTER SUCT 1200ML W/VALVE (MISCELLANEOUS) ×3 IMPLANT
CLIP HMST 235XBRD CATH ROT (MISCELLANEOUS) IMPLANT
CLIP RESOLUTION 360 11X235 (MISCELLANEOUS)
ELECT REM PT RETURN 9FT ADLT (ELECTROSURGICAL)
ELECTRODE REM PT RTRN 9FT ADLT (ELECTROSURGICAL) IMPLANT
FCP ESCP3.2XJMB 240X2.8X (MISCELLANEOUS)
FORCEPS BIOP RAD 4 LRG CAP 4 (CUTTING FORCEPS) IMPLANT
FORCEPS BIOP RJ4 240 W/NDL (MISCELLANEOUS)
FORCEPS ESCP3.2XJMB 240X2.8X (MISCELLANEOUS) IMPLANT
GOWN CVR UNV OPN BCK APRN NK (MISCELLANEOUS) ×2 IMPLANT
GOWN ISOL THUMB LOOP REG UNIV (MISCELLANEOUS) ×4
INJECTOR VARIJECT VIN23 (MISCELLANEOUS) IMPLANT
KIT DEFENDO VALVE AND CONN (KITS) IMPLANT
KIT ENDO PROCEDURE OLY (KITS) ×3 IMPLANT
MARKER SPOT ENDO TATTOO 5ML (MISCELLANEOUS) IMPLANT
PROBE APC STR FIRE (PROBE) IMPLANT
RETRIEVER NET ROTH 2.5X230 LF (MISCELLANEOUS) IMPLANT
SNARE COLD EXACTO (MISCELLANEOUS) IMPLANT
SNARE SHORT THROW 13M SML OVAL (MISCELLANEOUS) IMPLANT
SNARE SHORT THROW 30M LRG OVAL (MISCELLANEOUS) ×3 IMPLANT
SNARE SNG USE RND 15MM (INSTRUMENTS) IMPLANT
SPOT EX ENDOSCOPIC TATTOO (MISCELLANEOUS)
TRAP ETRAP POLY (MISCELLANEOUS) ×3 IMPLANT
VARIJECT INJECTOR VIN23 (MISCELLANEOUS)
WATER STERILE IRR 250ML POUR (IV SOLUTION) ×3 IMPLANT

## 2017-09-30 NOTE — Op Note (Signed)
[facility] Gastroenterology Patient Name: Bruce Kelley Procedure Date: 09/30/2017 7:42 AM MRN: [id number] Account #: [id number] Date of Birth: January 28, 1956 Admit Type: Outpatient Age: 62 Room: [facility] OR ROOM 01 Gender: Male Note Status: Finalized Procedure:            Colonoscopy Indications:          Screening for colorectal malignant neoplasm, This is                        the patient's first colonoscopy Providers:            Lin Landsman MD, MD Referring MD:         Juline Patch, MD (Referring MD) Medicines:            Monitored Anesthesia Care Complications:        No immediate complications. Estimated blood loss: None. Procedure:            Pre-Anesthesia Assessment:                       - Prior to the procedure, a History and Physical was                        performed, and patient medications and allergies were                        reviewed. The patient is competent. The risks and                        benefits of the procedure and the sedation options and                        risks were discussed with the patient. All questions                        were answered and informed consent was obtained.                        Patient identification and proposed procedure were                        verified by the physician, the nurse, the                        anesthesiologist, the anesthetist and the technician in                        the pre-procedure area in the procedure room in the                        endoscopy suite. Mental Status Examination: alert and                        oriented. Airway Examination: normal oropharyngeal                        airway and neck mobility. Respiratory Examination:                        clear to auscultation. CV Examination: normal.  Prophylactic Antibiotics: The patient does not require                        prophylactic antibiotics. Prior Anticoagulants: The             patient has taken no previous anticoagulant or                        antiplatelet agents. ASA Grade Assessment: II - A                        patient with mild systemic disease. After reviewing the                        risks and benefits, the patient was deemed in                        satisfactory condition to undergo the procedure. The                        anesthesia plan was to use monitored anesthesia care                        (MAC). Immediately prior to administration of                        medications, the patient was re-assessed for adequacy                        to receive sedatives. The heart rate, respiratory rate,                        oxygen saturations, blood pressure, adequacy of                        pulmonary ventilation, and response to care were                        monitored throughout the procedure. The physical status                        of the patient was re-assessed after the procedure.                       After obtaining informed consent, the colonoscope was                        passed under direct vision. Throughout the procedure,                        the patient's blood pressure, pulse, and oxygen                        saturations were monitored continuously. The was                        introduced through the anus and advanced to the the                        cecum, identified by appendiceal orifice and ileocecal  valve. The colonoscopy was performed without                        difficulty. The patient tolerated the procedure well.                        The quality of the bowel preparation was evaluated                        using the BBPS [facility] Bowel Preparation Scale) with                        scores of: Right Colon = 3, Transverse Colon = 3 and                        Left Colon = 3 (entire mucosa seen well with no                        residual staining, small fragments of stool or opaque                         liquid). The total BBPS score equals 9. Findings:      The perianal and digital rectal examinations were normal. Pertinent       negatives include normal sphincter tone and no palpable rectal lesions.      Four sessile polyps were found in the rectum, descending colon and       ascending colon. The polyps were 3 to 5 mm in size. These polyps were       removed with a cold snare. Resection and retrieval were complete.      The retroflexed view of the distal rectum and anal verge was normal and       showed no anal or rectal abnormalities. Impression:           - Four 3 to 5 mm polyps in the rectum, in the                        descending colon and in the ascending colon, removed                        with a cold snare. Resected and retrieved.                       - The distal rectum and anal verge are normal on                        retroflexion view. Recommendation:       - Discharge patient to home (with escort).                       - Resume previous diet today.                       - Continue present medications.                       - Await pathology results.                       - Repeat colonoscopy in  3 years for surveillance of                        multiple polyps. Procedure Code(s):    --- Professional ---                       [phone], Colonoscopy, flexible; with removal of tumor(s),                        polyp(s), or other lesion(s) by snare technique Diagnosis Code(s):    --- Professional ---                       Z12.11, Encounter for screening for malignant neoplasm                        of colon                       K62.1, Rectal polyp                       D12.4, Benign neoplasm of descending colon                       D12.2, Benign neoplasm of ascending colon CPT copyright 2017 American Medical Association. All rights reserved. The codes documented in this report are preliminary and upon coder review may  be revised to meet current  compliance requirements. Dr. Ulyess Mort Lin Landsman MD, MD 09/30/2017 8:49:38 AM This report has been signed electronically. Number of Addenda: 0 Note Initiated On: 09/30/2017 7:42 AM Total Procedure Duration: 0 hours 22 minutes 2 seconds       [facility]

## 2017-09-30 NOTE — Anesthesia Procedure Notes (Signed)
Procedure Name: MAC Date/Time: 09/30/2017 8:20 AM Performed by: Lind Guest, CRNA Pre-anesthesia Checklist: Patient identified, Emergency Drugs available, Suction available, Patient being monitored and Timeout performed Patient Re-evaluated:Patient Re-evaluated prior to induction Oxygen Delivery Method: Nasal cannula

## 2017-09-30 NOTE — Transfer of Care (Signed)
Immediate Anesthesia Transfer of Care Note  Patient: Bruce Kelley  Procedure(s) Performed: COLONOSCOPY WITH PROPOFOL (N/A )  Patient Location: PACU  Anesthesia Type: General  Level of Consciousness: awake, alert  and patient cooperative  Airway and Oxygen Therapy: Patient Spontanous Breathing and Patient connected to supplemental oxygen  Post-op Assessment: Post-op Vital signs reviewed, Patient's Cardiovascular Status Stable, Respiratory Function Stable, Patent Airway and No signs of Nausea or vomiting  Post-op Vital Signs: Reviewed and stable  Complications: No apparent anesthesia complications

## 2017-09-30 NOTE — Anesthesia Postprocedure Evaluation (Signed)
Anesthesia Post Note  Patient: Bruce Kelley  Procedure(s) Performed: COLONOSCOPY WITH PROPOFOL (N/A )  Patient location during evaluation: PACU Anesthesia Type: General Level of consciousness: awake Pain management: pain level controlled Vital Signs Assessment: post-procedure vital signs reviewed and stable Respiratory status: respiratory function stable Cardiovascular status: stable Postop Assessment: no signs of nausea or vomiting Anesthetic complications: no    Veda Canning

## 2017-09-30 NOTE — Discharge Instructions (Signed)
General Anesthesia, Adult, Care After °These instructions provide you with information about caring for yourself after your procedure. Your health care provider may also give you more specific instructions. Your treatment has been planned according to current medical practices, but problems sometimes occur. Call your health care provider if you have any problems or questions after your procedure. °What can I expect after the procedure? °After the procedure, it is common to have: °· Vomiting. °· A sore throat. °· Mental slowness. ° °It is common to feel: °· Nauseous. °· Cold or shivery. °· Sleepy. °· Tired. °· Sore or achy, even in parts of your body where you did not have surgery. ° °Follow these instructions at home: °For at least 24 hours after the procedure: °· Do not: °? Participate in activities where you could fall or become injured. °? Drive. °? Use heavy machinery. °? Drink alcohol. °? Take sleeping pills or medicines that cause drowsiness. °? Make important decisions or sign legal documents. °? Take care of children on your own. °· Rest. °Eating and drinking °· If you vomit, drink water, juice, or soup when you can drink without vomiting. °· Drink enough fluid to keep your urine clear or pale yellow. °· Make sure you have little or no nausea before eating solid foods. °· Follow the diet recommended by your health care provider. °General instructions °· Have a responsible adult stay with you until you are awake and alert. °· Return to your normal activities as told by your health care provider. Ask your health care provider what activities are safe for you. °· Take over-the-counter and prescription medicines only as told by your health care provider. °· If you smoke, do not smoke without supervision. °· Keep all follow-up visits as told by your health care provider. This is important. °Contact a health care provider if: °· You continue to have nausea or vomiting at home, and medicines are not helpful. °· You  cannot drink fluids or start eating again. °· You cannot urinate after 8-12 hours. °· You develop a skin rash. °· You have fever. °· You have increasing redness at the site of your procedure. °Get help right away if: °· You have difficulty breathing. °· You have chest pain. °· You have unexpected bleeding. °· You feel that you are having a life-threatening or urgent problem. °This information is not intended to replace advice given to you by your health care provider. Make sure you discuss any questions you have with your health care provider. °Document Released: 06/30/2000 Document Revised: 08/27/2015 Document Reviewed: 03/08/2015 °Elsevier Interactive Patient Education © 2018 [employer]. ° °

## 2017-09-30 NOTE — H&P (Signed)
Bruce Darby, MD [address]  [place]  [place], [place] [address]  Main: [phone]  Fax: [phone] Pager: [phone]  Primary Care Physician:  Juline Patch, MD Primary Gastroenterologist:  Dr. Cephas Kelley  Pre-Procedure History & Physical: HPI:  Bruce Kelley is a 62 y.o. male is here for an colonoscopy.   Past Medical History:  Diagnosis Date  . GERD (gastroesophageal reflux disease)   . Hypertension    controlled    Past Surgical History:  Procedure Laterality Date  . APPENDECTOMY      Prior to Admission medications   Medication Sig Start Date End Date Taking? Authorizing Provider  losartan-hydrochlorothiazide (HYZAAR) 50-12.5 MG tablet Take 1 tablet by mouth daily. am   Yes [provider]  doxycycline (VIBRA-TABS) 100 MG tablet Take 1 tablet (100 mg total) by mouth 2 (two) times daily. Patient not taking: Reported on 09/24/2017 08/04/17   Juline Patch, MD  hydrochlorothiazide (MICROZIDE) 12.5 MG capsule Take 1 capsule (12.5 mg total) by mouth daily. Patient not taking: Reported on 09/24/2017 08/18/17   Juline Patch, MD  lisinopril-hydrochlorothiazide (PRINZIDE,ZESTORETIC) 10-12.5 MG tablet TAKE 1 TABLET BY MOUTH DAILY. NEEDS APT FOR REFILLS Patient not taking: Reported on 09/24/2017 08/17/17   Juline Patch, MD  losartan (COZAAR) 50 MG tablet Take 1 tablet (50 mg total) by mouth daily. Patient not taking: Reported on 09/24/2017 08/18/17   Juline Patch, MD  pantoprazole (PROTONIX) 40 MG tablet Take 1 tablet (40 mg total) by mouth daily. Patient not taking: Reported on 09/24/2017 08/04/17   Juline Patch, MD    Allergies as of 08/07/2017  . (No Known Allergies)    History reviewed. No pertinent family history.  Social History   Socioeconomic History  . Marital status: Married    Spouse name: Not on file  . Number of children: Not on file  . Years of education: Not on file  . Highest education level: Not on file    Occupational History  . Not on file  Social Needs  . Financial resource strain: Not on file  . Food insecurity:    Worry: Not on file    Inability: Not on file  . Transportation needs:    Medical: Not on file    Non-medical: Not on file  Tobacco Use  . Smoking status: Current Every Day Smoker    Packs/day: 1.00    Years: 40.00    Pack years: 40.00    Types: Cigarettes  . Smokeless tobacco: Never Used  . Tobacco comment: gave info about ZYban and patches  Substance and Sexual Activity  . Alcohol use: Yes    Alcohol/week: 0.0 oz    Comment: occasionally  . Drug use: No  . Sexual activity: Yes  Lifestyle  . Physical activity:    Days per week: Not on file    Minutes per session: Not on file  . Stress: Not on file  Relationships  . Social connections:    Talks on phone: Not on file    Gets together: Not on file    Attends religious service: Not on file    Active member of club or organization: Not on file    Attends meetings of clubs or organizations: Not on file    Relationship status: Not on file  . Intimate partner violence:    Fear of current or ex partner: Not on file    Emotionally abused: Not on file  Physically abused: Not on file    Forced sexual activity: Not on file  Other Topics Concern  . Not on file  Social History Narrative  . Not on file    Review of Systems: See HPI, otherwise negative ROS  Physical Exam: BP (!) 126/93   Pulse 96   Temp 98.1 F (36.7 C) (Temporal)   Resp 16   Ht 5\' 11"  (1.803 m)   Wt 163 lb (73.9 kg)   SpO2 99%   BMI 22.73 kg/m  General:   Alert,  pleasant and cooperative in NAD Head:  Normocephalic and atraumatic. Neck:  Supple; no masses or thyromegaly. Lungs:  Clear throughout to auscultation.    Heart:  Regular rate and rhythm. Abdomen:  Soft, nontender and nondistended. Normal bowel sounds, without guarding, and without rebound.   Neurologic:  Alert and  oriented x4;  grossly normal  neurologically.  Impression/Plan: Bruce Kelley is here for an colonoscopy to be performed for colon cancer screening  Risks, benefits, limitations, and alternatives regarding  colonoscopy have been reviewed with the patient.  Questions have been answered.  All parties agreeable.   Sherri Sear, MD  09/30/2017, 8:05 AM

## 2017-09-30 NOTE — Anesthesia Preprocedure Evaluation (Addendum)
Anesthesia Evaluation  Patient identified by MRN, date of birth, ID band  Reviewed: Allergy & Precautions, NPO status , Patient's Chart, lab work & pertinent test results  Airway Mallampati: II  TM Distance: <3 FB     Dental   Pulmonary Current Smoker (1 PPD),    breath sounds clear to auscultation       Cardiovascular hypertension, negative cardio ROS   Rhythm:Regular Rate:Normal     Neuro/Psych    GI/Hepatic GERD  Medicated,  Endo/Other  negative endocrine ROS  Renal/GU      Musculoskeletal negative musculoskeletal ROS (+)   Abdominal   Peds  Hematology negative hematology ROS (+)   Anesthesia Other Findings   Reproductive/Obstetrics                            Anesthesia Physical Anesthesia Plan  ASA: II  Anesthesia Plan: General   Post-op Pain Management:    Induction: Intravenous  PONV Risk Score and Plan:   Airway Management Planned: Natural Airway  Additional Equipment:   Intra-op Plan:   Post-operative Plan:   Informed Consent: I have reviewed the patients History and Physical, chart, labs and discussed the procedure including the risks, benefits and alternatives for the proposed anesthesia with the patient or authorized representative who has indicated his/her understanding and acceptance.     Plan Discussed with: CRNA  Anesthesia Plan Comments:         Anesthesia Quick Evaluation

## 2017-10-01 ENCOUNTER — Encounter: Payer: Self-pay | Admitting: Gastroenterology

## 2017-10-02 ENCOUNTER — Encounter: Payer: Self-pay | Admitting: Gastroenterology

## 2018-04-15 ENCOUNTER — Encounter: Payer: Self-pay | Admitting: Family Medicine

## 2018-04-15 ENCOUNTER — Ambulatory Visit: Payer: BLUE CROSS/BLUE SHIELD | Admitting: Family Medicine

## 2018-04-15 VITALS — BP 138/82 | HR 72 | Resp 16 | Ht 71.0 in | Wt 167.0 lb

## 2018-04-15 DIAGNOSIS — H6983 Other specified disorders of Eustachian tube, bilateral: Secondary | ICD-10-CM

## 2018-04-15 DIAGNOSIS — H6501 Acute serous otitis media, right ear: Secondary | ICD-10-CM

## 2018-04-15 MED ORDER — PREDNISONE 10 MG PO TABS: 10.0000 mg | ORAL_TABLET | Freq: Every day | ORAL | 0 refills | Status: DC

## 2018-04-15 NOTE — Progress Notes (Signed)
Date:  04/15/2018   Name:  Bruce Kelley   DOB:  1955/10/27   MRN:  [id number]   Chief Complaint: Ear Fullness (4 weeks of ear fullness R worse than L and NEVER had this before. No pain at all. Can not hear well. )  Ear Fullness   There is pain in both (R>L) ears. This is a recurrent problem. The current episode started 1 to 4 weeks ago (4 weeks). The problem occurs constantly. The problem has been gradually improving. There has been no fever. The fever has been present for less than 1 day. The pain is moderate. Pertinent negatives include no abdominal pain, coughing, diarrhea, ear discharge, headaches, hearing loss, neck pain, rash, rhinorrhea, sore throat or vomiting. He has tried NSAIDs for the symptoms. The treatment provided moderate relief.    Review of Systems  Constitutional: Negative for chills and fever.  HENT: Negative for drooling, ear discharge, ear pain, hearing loss, rhinorrhea and sore throat.   Respiratory: Negative for cough, shortness of breath and wheezing.   Cardiovascular: Negative for chest pain, palpitations and leg swelling.  Gastrointestinal: Negative for abdominal pain, blood in stool, constipation, diarrhea, nausea and vomiting.  Endocrine: Negative for polydipsia.  Genitourinary: Negative for dysuria, frequency, hematuria and urgency.  Musculoskeletal: Negative for back pain, myalgias and neck pain.  Skin: Negative for rash.  Allergic/Immunologic: Negative for environmental allergies.  Neurological: Negative for dizziness and headaches.  Hematological: Does not bruise/bleed easily.  Psychiatric/Behavioral: Negative for suicidal ideas. The patient is not nervous/anxious.     Patient Active Problem List   Diagnosis Date Noted  . Screening for colon cancer   . Elevated blood pressure, situational 08/09/2014  . Routine general medical examination at a health care facility 08/09/2014  . 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 Torticollis 08/09/2014  . Acid  reflux 08/09/2014    No Known Allergies  Past Surgical History:  Procedure Laterality Date  . APPENDECTOMY    . COLONOSCOPY WITH PROPOFOL N/A 09/30/2017   Procedure: COLONOSCOPY WITH PROPOFOL;  Surgeon: Lin Landsman, MD;  Location: [facility];  Service: Endoscopy;  Laterality: N/A;    Social History   Tobacco Use  . Smoking status: Current Every Day Smoker    Packs/day: 1.00    Years: 40.00    Pack years: 40.00    Types: Cigarettes  . Smokeless tobacco: Never Used  . Tobacco comment: gave info about ZYban and patches  Substance Use Topics  . Alcohol use: Yes    Alcohol/week: 0.0 standard drinks    Comment: occasionally  . Drug use: No     Medication list has been reviewed and updated.  Current Meds  Medication Sig  . losartan-hydrochlorothiazide (HYZAAR) 50-12.5 MG tablet Take 1 tablet by mouth daily. am  . nicotine (NICODERM CQ - DOSED IN MG/24 HOURS) 21 mg/24hr patch Place 21 mg onto the skin daily.    PHQ 2/9 Scores 08/04/2017 05/25/2015  PHQ - 2 Score 0 0  PHQ- 9 Score 0 -    Physical Exam Vitals signs and nursing note reviewed.  HENT:     Head: Normocephalic.     Right Ear: External ear normal.     Left Ear: External ear normal.     Nose: Nose normal.  Eyes:     General: No scleral icterus.       Right eye: No discharge.        Left eye: No discharge.     Conjunctiva/sclera:  Conjunctivae normal.     Pupils: Pupils are equal, round, and reactive to light.  Neck:     Musculoskeletal: Normal range of motion and neck supple.     Thyroid: No thyromegaly.     Vascular: No JVD.     Trachea: No tracheal deviation.  Cardiovascular:     Rate and Rhythm: Normal rate and regular rhythm.     Heart sounds: Normal heart sounds. No murmur. No friction rub. No gallop.   Pulmonary:     Effort: No respiratory distress.     Breath sounds: Normal breath sounds. No wheezing or rales.  Abdominal:     General: Bowel sounds are normal.     Palpations:  Abdomen is soft. There is no mass.     Tenderness: There is no abdominal tenderness. There is no guarding or rebound.  Musculoskeletal: Normal range of motion.        General: No tenderness.  Lymphadenopathy:     Cervical: No cervical adenopathy.  Skin:    General: Skin is warm.     Findings: No rash.  Neurological:     Mental Status: He is alert and oriented to person, place, and time.     Cranial Nerves: No cranial nerve deficit.     Deep Tendon Reflexes: Reflexes are normal and symmetric.     BP 138/82   Pulse 72   Resp 16   Ht 5\' 11"  (1.803 m)   Wt 167 lb (75.8 kg)   SpO2 98%   BMI 23.29 kg/m   Assessment and Plan: 1. Non-recurrent acute serous otitis media of right ear Patient has a serous otitis media of an acute nature of the right ear.  Will initiate prednisone 20 mg for 4 days and 10 mg for additional 10 days. - predniSONE (DELTASONE) 10 MG tablet; Take 1 tablet (10 mg total) by mouth daily with breakfast.  Dispense: 30 tablet; Refill: 0  2. Dysfunction of both eustachian tubes Patient advised to obtain Afrin nasal spray and Sudafed 30 mg 1 a day to assist opening and decompression of his bilateral serous otitis.

## 2018-05-25 ENCOUNTER — Other Ambulatory Visit: Payer: Self-pay | Admitting: Family Medicine

## 2018-05-25 ENCOUNTER — Other Ambulatory Visit: Payer: Self-pay

## 2018-05-25 MED ORDER — LOSARTAN POTASSIUM 50 MG PO TABS: 50.0000 mg | ORAL_TABLET | Freq: Every day | ORAL | 0 refills | Status: DC

## 2018-05-25 MED ORDER — HYDROCHLOROTHIAZIDE 12.5 MG PO TABS: 12.5000 mg | ORAL_TABLET | Freq: Every day | ORAL | 0 refills | Status: DC

## 2018-05-26 ENCOUNTER — Encounter: Payer: Self-pay | Admitting: Family Medicine

## 2018-05-26 ENCOUNTER — Ambulatory Visit: Payer: BLUE CROSS/BLUE SHIELD | Admitting: Family Medicine

## 2018-05-26 VITALS — BP 120/80 | HR 84 | Ht 71.0 in | Wt 166.0 lb

## 2018-05-26 DIAGNOSIS — I10 Essential (primary) hypertension: Secondary | ICD-10-CM

## 2018-05-26 DIAGNOSIS — Z23 Encounter for immunization: Secondary | ICD-10-CM

## 2018-05-26 DIAGNOSIS — Z87828 Personal history of other (healed) physical injury and trauma: Secondary | ICD-10-CM

## 2018-05-26 DIAGNOSIS — Z8739 Personal history of other diseases of the musculoskeletal system and connective tissue: Secondary | ICD-10-CM

## 2018-05-26 MED ORDER — MELOXICAM 15 MG PO TABS: 15.0000 mg | ORAL_TABLET | Freq: Every day | ORAL | 2 refills | Status: DC

## 2018-05-26 MED ORDER — HYDROCHLOROTHIAZIDE 12.5 MG PO TABS: 12.5000 mg | ORAL_TABLET | Freq: Every day | ORAL | 6 refills | Status: DC

## 2018-05-26 MED ORDER — LOSARTAN POTASSIUM 50 MG PO TABS: 50.0000 mg | ORAL_TABLET | Freq: Every day | ORAL | 6 refills | Status: DC

## 2018-05-26 NOTE — Progress Notes (Signed)
Date:  05/26/2018   Name:  Bruce Kelley   DOB:  September 08, 1955   MRN:  [id number]   Chief Complaint: Hypertension and Hip Pain (fell out of truck 6 months ago- still has a pain in R) hip)  Hypertension  This is a chronic problem. The current episode started more than 1 year ago. The problem has been gradually worsening since onset. The problem is controlled. Pertinent negatives include no anxiety, blurred vision, chest pain, headaches, malaise/fatigue, neck pain, orthopnea, palpitations, peripheral edema, PND, shortness of breath or sweats. There are no associated agents to hypertension. There are no known risk factors for coronary artery disease. Past treatments include angiotensin blockers and diuretics. The current treatment provides moderate improvement. There are no compliance problems.  There is no history of angina, kidney disease, CAD/MI, CVA, heart failure, left ventricular hypertrophy, PVD or retinopathy. There is no history of chronic renal disease, a hypertension causing med or renovascular disease.  Hip Pain   There was no injury mechanism. The quality of the pain is described as aching. The pain is moderate. The pain has been intermittent since onset. The symptoms are aggravated by palpation. He has tried acetaminophen for the symptoms. The treatment provided moderate relief.    Review of Systems  Constitutional: Negative for chills, fever and malaise/fatigue.  HENT: Negative for drooling, ear discharge, ear pain and sore throat.   Eyes: Negative for blurred vision.  Respiratory: Negative for cough, shortness of breath and wheezing.   Cardiovascular: Negative for chest pain, palpitations, orthopnea, leg swelling and PND.  Gastrointestinal: Negative for abdominal pain, blood in stool, constipation, diarrhea and nausea.  Endocrine: Negative for polydipsia.  Genitourinary: Negative for dysuria, frequency, hematuria and urgency.  Musculoskeletal: Negative for back pain, myalgias  and neck pain.  Skin: Negative for rash.  Allergic/Immunologic: Negative for environmental allergies.  Neurological: Negative for dizziness and headaches.  Hematological: Does not bruise/bleed easily.  Psychiatric/Behavioral: Negative for suicidal ideas. The patient is not nervous/anxious.     Patient Active Problem List   Diagnosis Date Noted  . Screening for colon cancer   . Elevated blood pressure, situational 08/09/2014  . Routine general medical examination at a health care facility 08/09/2014  . 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 Torticollis 08/09/2014  . Acid reflux 08/09/2014    No Known Allergies  Past Surgical History:  Procedure Laterality Date  . APPENDECTOMY    . COLONOSCOPY WITH PROPOFOL N/A 09/30/2017   Procedure: COLONOSCOPY WITH PROPOFOL;  Surgeon: Lin Landsman, MD;  Location: [facility];  Service: Endoscopy;  Laterality: N/A;    Social History   Tobacco Use  . Smoking status: Current Every Day Smoker    Packs/day: 1.00    Years: 40.00    Pack years: 40.00    Types: Cigarettes  . Smokeless tobacco: Never Used  . Tobacco comment: gave info about ZYban and patches  Substance Use Topics  . Alcohol use: Yes    Alcohol/week: 0.0 standard drinks    Comment: occasionally  . Drug use: No     Medication list has been reviewed and updated.  Current Meds  Medication Sig  . hydrochlorothiazide (HYDRODIURIL) 12.5 MG tablet Take 1 tablet (12.5 mg total) by mouth daily.  Marland Kitchen losartan (COZAAR) 50 MG tablet TAKE 1 TABLET BY MOUTH ONCE DAILY  . nicotine (NICODERM CQ - DOSED IN MG/24 HOURS) 21 mg/24hr patch Place 21 mg onto the skin daily.    PHQ 2/9 Scores 08/04/2017 05/25/2015  PHQ - 2 Score 0 0  PHQ- 9 Score 0 -    Physical Exam Vitals signs and nursing note reviewed.  HENT:     Head: Normocephalic.     Right Ear: External ear normal.     Left Ear: External ear normal.     Nose: Nose normal.  Eyes:     General: No scleral icterus.        Right eye: No discharge.        Left eye: No discharge.     Conjunctiva/sclera: Conjunctivae normal.     Pupils: Pupils are equal, round, and reactive to light.  Neck:     Musculoskeletal: Normal range of motion and neck supple.     Thyroid: No thyromegaly.     Vascular: No JVD.     Trachea: No tracheal deviation.  Cardiovascular:     Rate and Rhythm: Normal rate and regular rhythm.     Heart sounds: Normal heart sounds. No murmur. No friction rub. No gallop.   Pulmonary:     Effort: No respiratory distress.     Breath sounds: Normal breath sounds. No wheezing or rales.  Abdominal:     General: Bowel sounds are normal.     Palpations: Abdomen is soft. There is no mass.     Tenderness: There is no abdominal tenderness. There is no guarding or rebound.  Musculoskeletal: Normal range of motion.     Right hip: He exhibits tenderness. He exhibits normal range of motion, normal strength, no bony tenderness and no swelling.  Lymphadenopathy:     Cervical: No cervical adenopathy.  Skin:    General: Skin is warm.     Findings: No rash.  Neurological:     Mental Status: He is alert and oriented to person, place, and time.     Cranial Nerves: No cranial nerve deficit.     Deep Tendon Reflexes: Reflexes are normal and symmetric.     BP 120/80   Pulse 84   Ht 5\' 11"  (1.803 m)   Wt 166 lb (75.3 kg)   BMI 23.15 kg/m   Assessment and Plan: 1. Essential hypertension Chronic.  Controlled.  Continue hydrochlorothiazide 12.5 mg daily and losartan 50 mg daily reviewed last renal panel which was normal. - hydrochlorothiazide (HYDRODIURIL) 12.5 MG tablet; Take 1 tablet (12.5 mg total) by mouth daily.  Dispense: 30 tablet; Refill: 6 - losartan (COZAAR) 50 MG tablet; Take 1 tablet (50 mg total) by mouth daily.  Dispense: 30 tablet; Refill: 6  2. History of leg sprain Course of exam patient was noted to have some discomfort on the lateral aspect of his right hip.  Patient was given a trial of  Mobic 15 mg once a day. - meloxicam (MOBIC) 15 MG tablet; Take 1 tablet (15 mg total) by mouth daily.  Dispense: 30 tablet; Refill: 2  3. Flu vaccine need Discussed and administered. - Flu Vaccine QUAD 6+ mos PF IM (Fluarix Quad PF)

## 2019-01-26 ENCOUNTER — Encounter: Payer: Self-pay | Admitting: Family Medicine

## 2019-01-26 ENCOUNTER — Other Ambulatory Visit: Payer: Self-pay

## 2019-01-26 ENCOUNTER — Ambulatory Visit: Payer: BC Managed Care – PPO | Admitting: Family Medicine

## 2019-01-26 VITALS — BP 119/78 | HR 98 | Resp 16 | Ht 71.0 in | Wt 164.0 lb

## 2019-01-26 DIAGNOSIS — R351 Nocturia: Secondary | ICD-10-CM | POA: Diagnosis not present

## 2019-01-26 DIAGNOSIS — F17210 Nicotine dependence, cigarettes, uncomplicated: Secondary | ICD-10-CM

## 2019-01-26 DIAGNOSIS — I10 Essential (primary) hypertension: Secondary | ICD-10-CM

## 2019-01-26 DIAGNOSIS — Z23 Encounter for immunization: Secondary | ICD-10-CM

## 2019-01-26 MED ORDER — TAMSULOSIN HCL 0.4 MG PO CAPS: 0.4000 mg | ORAL_CAPSULE | Freq: Every day | ORAL | 3 refills | Status: DC

## 2019-01-26 MED ORDER — LOSARTAN POTASSIUM 50 MG PO TABS: 50.0000 mg | ORAL_TABLET | Freq: Every day | ORAL | 6 refills | Status: DC

## 2019-01-26 MED ORDER — HYDROCHLOROTHIAZIDE 12.5 MG PO TABS: 12.5000 mg | ORAL_TABLET | Freq: Every day | ORAL | 6 refills | Status: DC

## 2019-01-26 NOTE — Progress Notes (Signed)
Date:  01/26/2019   Name:  Bruce Kelley   DOB:  03/21/1956   MRN:  [id number]   Chief Complaint: Hypertension  Hypertension This is a chronic problem. The current episode started more than 1 year ago. The problem is controlled. Pertinent negatives include no anxiety, blurred vision, chest pain, headaches, malaise/fatigue, neck pain, orthopnea, palpitations, peripheral edema, PND, shortness of breath or sweats. There are no associated agents to hypertension. Risk factors for coronary artery disease include diabetes mellitus. Past treatments include ACE inhibitors and diuretics. The current treatment provides moderate improvement. There are no compliance problems.  There is no history of angina, kidney disease, CAD/MI, CVA, heart failure, left ventricular hypertrophy, PVD or retinopathy. There is no history of chronic renal disease, a hypertension causing med or renovascular disease.  Benign Prostatic Hypertrophy This is a recurrent problem. The current episode started more than 1 year ago. The problem has been waxing and waning since onset. Irritative symptoms include nocturia. Irritative symptoms do not include frequency or urgency. Obstructive symptoms include a slower stream and a weak stream. Associated symptoms include hesitancy. Pertinent negatives include no chills, dysuria, hematuria or nausea.    Review of Systems  Constitutional: Negative for chills, diaphoresis, fatigue, fever, malaise/fatigue and unexpected weight change.  HENT: Negative for congestion, drooling, ear discharge, ear pain, sinus pressure, sinus pain and sore throat.   Eyes: Negative for blurred vision and visual disturbance.  Respiratory: Negative for cough, shortness of breath and wheezing.   Cardiovascular: Negative for chest pain, palpitations, orthopnea, leg swelling and PND.  Gastrointestinal: Negative for abdominal pain, blood in stool, constipation, diarrhea and nausea.  Endocrine: Negative for  polydipsia.  Genitourinary: Positive for hesitancy and nocturia. Negative for dysuria, frequency, hematuria, scrotal swelling, testicular pain and urgency.  Musculoskeletal: Negative for back pain, myalgias and neck pain.  Skin: Negative for rash.  Allergic/Immunologic: Negative for environmental allergies.  Neurological: Negative for dizziness and headaches.  Hematological: Does not bruise/bleed easily.  Psychiatric/Behavioral: Negative for suicidal ideas. The patient is not nervous/anxious.     Patient Active Problem List   Diagnosis Date Noted  . Screening for colon cancer   . Elevated blood pressure, situational 08/09/2014  . Routine general medical examination at a health care facility 08/09/2014  . 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 Torticollis 08/09/2014  . Acid reflux 08/09/2014    No Known Allergies  Past Surgical History:  Procedure Laterality Date  . APPENDECTOMY    . COLONOSCOPY WITH PROPOFOL N/A 09/30/2017   Procedure: COLONOSCOPY WITH PROPOFOL;  Surgeon: Lin Landsman, MD;  Location: [facility];  Service: Endoscopy;  Laterality: N/A;    Social History   Tobacco Use  . Smoking status: Current Every Day Smoker    Packs/day: 0.50    Years: 40.00    Pack years: 20.00    Types: Cigarettes  . Smokeless tobacco: Never Used  . Tobacco comment: gave info about ZYban and patches  Substance Use Topics  . Alcohol use: Yes    Alcohol/week: 0.0 standard drinks    Comment: occasionally  . Drug use: No     Medication list has been reviewed and updated.  Current Meds  Medication Sig  . hydrochlorothiazide (HYDRODIURIL) 12.5 MG tablet Take 1 tablet (12.5 mg total) by mouth daily.  Marland Kitchen losartan (COZAAR) 50 MG tablet Take 1 tablet (50 mg total) by mouth daily.  . [DISCONTINUED] meloxicam (MOBIC) 15 MG tablet Take 1 tablet (15 mg total) by mouth daily.  PHQ 2/9 Scores 01/26/2019 08/04/2017 05/25/2015  PHQ - 2 Score 0 0 0  PHQ- 9 Score - 0 -    BP  Readings from Last 3 Encounters:  01/26/19 119/78  05/26/18 120/80  04/15/18 138/82    Physical Exam Vitals signs and nursing note reviewed.  HENT:     Head: Normocephalic.     Right Ear: External ear normal.     Left Ear: External ear normal.     Nose: Nose normal.  Eyes:     General: No scleral icterus.       Right eye: No discharge.        Left eye: No discharge.     Conjunctiva/sclera: Conjunctivae normal.     Pupils: Pupils are equal, round, and reactive to light.  Neck:     Musculoskeletal: Normal range of motion and neck supple.     Thyroid: No thyromegaly.     Vascular: No JVD.     Trachea: No tracheal deviation.  Cardiovascular:     Rate and Rhythm: Normal rate and regular rhythm.     Heart sounds: Normal heart sounds. No murmur. No friction rub. No gallop.   Pulmonary:     Effort: No respiratory distress.     Breath sounds: Normal breath sounds. No wheezing or rales.  Abdominal:     General: Bowel sounds are normal.     Palpations: Abdomen is soft. There is no mass.     Tenderness: There is no abdominal tenderness. There is no guarding or rebound.  Musculoskeletal: Normal range of motion.        General: No tenderness.  Lymphadenopathy:     Cervical: No cervical adenopathy.  Skin:    General: Skin is warm.     Findings: No rash.  Neurological:     Mental Status: He is alert and oriented to person, place, and time.     Cranial Nerves: No cranial nerve deficit.     Deep Tendon Reflexes: Reflexes are normal and symmetric.     Wt Readings from Last 3 Encounters:  01/26/19 164 lb (74.4 kg)  05/26/18 166 lb (75.3 kg)  04/15/18 167 lb (75.8 kg)    BP 119/78   Pulse 98   Resp 16   Ht 5\' 11"  (1.803 m)   Wt 164 lb (74.4 kg)   SpO2 98%   BMI 22.87 kg/m   Assessment and Plan: 1. Need for immunization against influenza Discussed and administered - Flu Vaccine QUAD 36+ mos IM  2. Essential hypertension Chronic.  Controlled.  Continue  hydrochlorothiazide 12.5 mg and losartan 50 mg once a day.  Will check renal function panel. - hydrochlorothiazide (HYDRODIURIL) 12.5 MG tablet; Take 1 tablet (12.5 mg total) by mouth daily.  Dispense: 30 tablet; Refill: 6 - losartan (COZAAR) 50 MG tablet; Take 1 tablet (50 mg total) by mouth daily.  Dispense: 30 tablet; Refill: 6 - Renal Function Panel  3. Nocturia Patient is having nocturia getting up more than twice at night as well as difficulty in initiating urination with hesitancy.  DRE is unremarkable for nodularity change in consistency although there is some mild enlargement.  Will check PSA and will initiate tamsulosin 0.4 mg once a day. - tamsulosin (FLOMAX) 0.4 MG CAPS capsule; Take 1 capsule (0.4 mg total) by mouth daily.  Dispense: 30 capsule; Refill: 3 - PSA  4. Cigarette nicotine dependence without complication Patient has a greater than 30-pack-year history of smoking.  Will contact Melinda Crutch for  evaluation with low-dose spiral CT.

## 2019-01-27 ENCOUNTER — Telehealth: Payer: Self-pay | Admitting: *Deleted

## 2019-01-27 LAB — RENAL FUNCTION PANEL
Albumin: 4 g/dL (ref 3.8–4.8)
BUN/Creatinine Ratio: 13 (ref 10–24)
BUN: 11 mg/dL (ref 8–27)
CO2: 25 mmol/L (ref 20–29)
Calcium: 8.9 mg/dL (ref 8.6–10.2)
Chloride: 102 mmol/L (ref 96–106)
Creatinine, Ser: 0.84 mg/dL (ref 0.76–1.27)
GFR calc Af Amer: 108 mL/min/{1.73_m2} (ref >59)
GFR calc non Af Amer: 94 mL/min/{1.73_m2} (ref >59)
Glucose: 131 mg/dL — ABNORMAL HIGH (ref 65–99)
Phosphorus: 3.4 mg/dL (ref 2.8–4.1)
Potassium: 3.9 mmol/L (ref 3.5–5.2)
Sodium: 142 mmol/L (ref 134–144)

## 2019-01-27 LAB — PSA: Prostate Specific Ag, Serum: 1.2 ng/mL (ref 0.0–4.0)

## 2019-01-27 NOTE — Telephone Encounter (Signed)
Received referral for low dose lung cancer screening CT scan. Message left at phone number listed in EMR for patient to call me back to facilitate scheduling scan.  

## 2019-02-07 ENCOUNTER — Telehealth: Payer: Self-pay | Admitting: *Deleted

## 2019-02-07 NOTE — Telephone Encounter (Signed)
Received referral for low dose lung cancer screening CT scan. Message left at phone number listed in EMR for patient to call me back to facilitate scheduling scan.  

## 2019-02-10 ENCOUNTER — Telehealth: Payer: Self-pay | Admitting: *Deleted

## 2019-02-10 ENCOUNTER — Encounter: Payer: Self-pay | Admitting: *Deleted

## 2019-02-10 DIAGNOSIS — Z87891 Personal history of nicotine dependence: Secondary | ICD-10-CM

## 2019-02-10 DIAGNOSIS — Z122 Encounter for screening for malignant neoplasm of respiratory organs: Secondary | ICD-10-CM

## 2019-02-10 NOTE — Telephone Encounter (Signed)
Received referral for initial lung cancer screening scan. Contacted patient and obtained smoking history,(current, 34.5 pack year) as well as answering questions related to screening process. Patient denies signs of lung cancer such as weight loss or hemoptysis. Patient denies comorbidity that would prevent curative treatment if lung cancer were found. Patient is scheduled for shared decision making visit and CT scan on 02/22/19 at 145pm.

## 2019-02-22 ENCOUNTER — Inpatient Hospital Stay: Payer: BC Managed Care – PPO | Attending: Oncology | Admitting: Nurse Practitioner

## 2019-02-22 ENCOUNTER — Ambulatory Visit
Admission: RE | Admit: 2019-02-22 | Discharge: 2019-02-22 | Disposition: A | Discharge status: Home / Self Care | Payer: BC Managed Care – PPO | Source: Ambulatory Visit | Attending: Oncology | Admitting: Oncology

## 2019-02-22 ENCOUNTER — Other Ambulatory Visit: Payer: Self-pay

## 2019-02-22 DIAGNOSIS — Z87891 Personal history of nicotine dependence: Secondary | ICD-10-CM | POA: Diagnosis not present

## 2019-02-22 DIAGNOSIS — Z122 Encounter for screening for malignant neoplasm of respiratory organs: Secondary | ICD-10-CM | POA: Diagnosis present

## 2019-02-22 NOTE — Progress Notes (Signed)
Virtual Visit via Video Enabled Telemedicine Note   I connected with Bruce Kelley on 02/22/19 at 1:45 PM EST by video enabled telemedicine visit and verified that I am speaking with the correct person using two identifiers.   I discussed the limitations, risks, security and privacy concerns of performing an evaluation and management service by telemedicine and the availability of in-person appointments. I also discussed with the patient that there may be a patient responsible charge related to this service. The patient expressed understanding and agreed to proceed.   Other persons participating in the visit and their role in the encounter: Burgess Estelle, RN- checking in patient & navigation  Patient's location: [place]  Provider's location: Clinic  Chief Complaint: Low Dose CT Screening  Patient agreed to evaluation by telemedicine to discuss shared decision making for consideration of low dose CT lung cancer screening.    In accordance with CMS guidelines, patient has met eligibility criteria including age, absence of signs or symptoms of lung cancer.  Social History   Tobacco Use  . Smoking status: Current Every Day Smoker    Packs/day: 0.75    Years: 46.00    Pack years: 34.50    Types: Cigarettes  . Smokeless tobacco: Never Used  . Tobacco comment: gave info about ZYban and patches  Substance Use Topics  . Alcohol use: Yes    Alcohol/week: 0.0 standard drinks    Comment: occasionally     A shared decision-making session was conducted prior to the performance of CT scan. This includes one or more decision aids, includes benefits and harms of screening, follow-up diagnostic testing, over-diagnosis, false positive rate, and total radiation exposure.   Counseling on the importance of adherence to annual lung cancer LDCT screening, impact of co-morbidities, and ability or willingness to undergo diagnosis and treatment is imperative for compliance of the program.    Counseling on the importance of continued smoking cessation for former smokers; the importance of smoking cessation for current smokers, and information about tobacco cessation interventions have been given to patient including [facility] and [facility] [employer] [place] programs.   Written order for lung cancer screening with LDCT has been given to the patient and any and all questions have been answered to the best of my abilities.    Yearly follow up will be coordinated by Burgess Estelle, Thoracic Navigator.  I discussed the assessment and treatment plan with the patient. The patient was provided an opportunity to ask questions and all were answered. The patient agreed with the plan and demonstrated an understanding of the instructions.   The patient was advised to call back or seek an in-person evaluation if the symptoms worsen or if the condition fails to improve as anticipated.   I provided 15 minutes of face-to-face video visit time during this encounter, and > 50% was spent counseling as documented under my assessment & plan.   Beckey Rutter, DNP, AGNP-C [place] at [facility] [phone] (clinic)

## 2019-02-24 ENCOUNTER — Encounter: Payer: Self-pay | Admitting: *Deleted

## 2019-09-07 ENCOUNTER — Other Ambulatory Visit: Payer: Self-pay | Admitting: Family Medicine

## 2019-09-07 ENCOUNTER — Other Ambulatory Visit: Payer: Self-pay

## 2019-09-07 ENCOUNTER — Ambulatory Visit: Payer: BC Managed Care – PPO | Admitting: Family Medicine

## 2019-09-07 DIAGNOSIS — I10 Essential (primary) hypertension: Secondary | ICD-10-CM

## 2019-09-07 MED ORDER — HYDROCHLOROTHIAZIDE 12.5 MG PO TABS: 12.5000 mg | ORAL_TABLET | Freq: Every day | ORAL | 0 refills | Status: DC

## 2019-09-07 MED ORDER — LOSARTAN POTASSIUM 50 MG PO TABS: 50.0000 mg | ORAL_TABLET | Freq: Every day | ORAL | 0 refills | Status: DC

## 2019-09-07 NOTE — Progress Notes (Unsigned)
Sent in meds x 14 days- need to see in office

## 2019-09-21 ENCOUNTER — Encounter: Payer: Self-pay | Admitting: Family Medicine

## 2019-09-21 ENCOUNTER — Ambulatory Visit: Payer: BC Managed Care – PPO | Admitting: Family Medicine

## 2019-09-21 ENCOUNTER — Other Ambulatory Visit: Payer: Self-pay

## 2019-09-21 VITALS — BP 130/80 | HR 80 | Ht 71.0 in | Wt 159.0 lb

## 2019-09-21 DIAGNOSIS — F17210 Nicotine dependence, cigarettes, uncomplicated: Secondary | ICD-10-CM | POA: Diagnosis not present

## 2019-09-21 DIAGNOSIS — J432 Centrilobular emphysema: Secondary | ICD-10-CM

## 2019-09-21 DIAGNOSIS — I10 Essential (primary) hypertension: Secondary | ICD-10-CM | POA: Diagnosis not present

## 2019-09-21 DIAGNOSIS — I700 Atherosclerosis of aorta: Secondary | ICD-10-CM | POA: Diagnosis not present

## 2019-09-21 MED ORDER — LOSARTAN POTASSIUM 50 MG PO TABS: 50.0000 mg | ORAL_TABLET | Freq: Every day | ORAL | 1 refills | Status: DC

## 2019-09-21 MED ORDER — HYDROCHLOROTHIAZIDE 12.5 MG PO TABS: 12.5000 mg | ORAL_TABLET | Freq: Every day | ORAL | 1 refills | Status: DC

## 2019-09-21 NOTE — Progress Notes (Addendum)
Date:  09/21/2019   Name:  Bruce Kelley   DOB:  1956-04-07   MRN:  [id number]   Chief Complaint: Hypertension  Hypertension This is a chronic problem. The current episode started more than 1 year ago. The problem has been gradually improving since onset. The problem is controlled. Pertinent negatives include no anxiety, blurred vision, chest pain, headaches, malaise/fatigue, neck pain, orthopnea, palpitations, peripheral edema, PND, shortness of breath or sweats. There are no associated agents to hypertension. Risk factors for coronary artery disease include dyslipidemia and post-menopausal state. Past treatments include diuretics and angiotensin blockers. The current treatment provides moderate improvement. There are no compliance problems.  There is no history of angina, kidney disease, CAD/MI, CVA, heart failure, left ventricular hypertrophy, PVD or retinopathy. There is no history of chronic renal disease, a hypertension causing med or renovascular disease.    Lab Results  Component Value Date   CREATININE 0.84 01/26/2019   BUN 11 01/26/2019   NA 142 01/26/2019   K 3.9 01/26/2019   CL 102 01/26/2019   CO2 25 01/26/2019   Lab Results  Component Value Date   CHOL 158 07/28/2014   HDL 37 07/28/2014   LDLCALC 93 07/28/2014   TRIG 141 07/28/2014   Lab Results  Component Value Date   TSH 1.460 08/04/2017   No results found for: HGBA1C Lab Results  Component Value Date   WBC 11.6 (H) 08/04/2017   HGB 15.9 08/04/2017   HCT 46.0 08/04/2017   MCV 93 08/04/2017   PLT 283 08/04/2017   No results found for: ALT, AST, GGT, ALKPHOS, BILITOT   Review of Systems  Constitutional: Negative for chills, fever and malaise/fatigue.  HENT: Negative for drooling, ear discharge, ear pain and sore throat.   Eyes: Negative for blurred vision.  Respiratory: Negative for cough, shortness of breath and wheezing.   Cardiovascular: Negative for chest pain, palpitations, orthopnea, leg  swelling and PND.  Gastrointestinal: Negative for abdominal pain, blood in stool, constipation, diarrhea and nausea.  Endocrine: Negative for polydipsia.  Genitourinary: Negative for dysuria, frequency, hematuria and urgency.  Musculoskeletal: Negative for back pain, myalgias and neck pain.  Skin: Negative for rash.  Allergic/Immunologic: Negative for environmental allergies.  Neurological: Negative for dizziness and headaches.  Hematological: Does not bruise/bleed easily.  Psychiatric/Behavioral: Negative for suicidal ideas. The patient is not nervous/anxious.     Patient Active Problem List   Diagnosis Date Noted  . Screening for colon cancer   . Elevated blood pressure, situational 08/09/2014  . Routine general medical examination at a health care facility 08/09/2014  . 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 Torticollis 08/09/2014  . Acid reflux 08/09/2014    No Known Allergies  Past Surgical History:  Procedure Laterality Date  . APPENDECTOMY    . COLONOSCOPY WITH PROPOFOL N/A 09/30/2017   Procedure: COLONOSCOPY WITH PROPOFOL;  Surgeon: Lin Landsman, MD;  Location: [facility];  Service: Endoscopy;  Laterality: N/A;    Social History   Tobacco Use  . Smoking status: Former Smoker    Packs/day: 0.75    Years: 46.00    Pack years: 34.50    Types: Cigarettes    Quit date: 09/06/2019    Years since quitting: 0.0  . Smokeless tobacco: Never Used  . Tobacco comment: gave info about ZYban and patches  Substance Use Topics  . Alcohol use: Yes    Alcohol/week: 0.0 standard drinks    Comment: occasionally  . Drug use: No  Medication list has been reviewed and updated.  Current Meds  Medication Sig  . hydrochlorothiazide (HYDRODIURIL) 12.5 MG tablet Take 1 tablet (12.5 mg total) by mouth daily.  Marland Kitchen losartan (COZAAR) 50 MG tablet Take 1 tablet (50 mg total) by mouth daily.    PHQ 2/9 Scores 09/21/2019 01/26/2019 08/04/2017 05/25/2015  PHQ - 2 Score 0 0 0 0    PHQ- 9 Score 0 - 0 -    GAD 7 : Generalized Anxiety Score 09/21/2019  Nervous, Anxious, on Edge 0  Control/stop worrying 0  Worry too much - different things 0  Trouble relaxing 0  Restless 0  Easily annoyed or irritable 0  Afraid - awful might happen 0  Total GAD 7 Score 0    BP Readings from Last 3 Encounters:  09/21/19 130/80  01/26/19 119/78  05/26/18 120/80    Physical Exam Vitals and nursing note reviewed.  HENT:     Head: Normocephalic.     Right Ear: Tympanic membrane, ear canal and external ear normal.     Left Ear: Tympanic membrane, ear canal and external ear normal.     Nose: Nose normal. No congestion.     Mouth/Throat:     Mouth: Mucous membranes are moist.  Eyes:     General: No scleral icterus.       Right eye: No discharge.        Left eye: No discharge.     Conjunctiva/sclera: Conjunctivae normal.     Pupils: Pupils are equal, round, and reactive to light.  Neck:     Thyroid: No thyromegaly.     Vascular: No JVD.     Trachea: No tracheal deviation.  Cardiovascular:     Rate and Rhythm: Normal rate and regular rhythm.     Heart sounds: Normal heart sounds. No murmur heard.  No friction rub. No gallop.   Pulmonary:     Effort: No respiratory distress.     Breath sounds: Normal breath sounds. No wheezing, rhonchi or rales.  Abdominal:     General: Bowel sounds are normal.     Palpations: Abdomen is soft. There is no mass.     Tenderness: There is no abdominal tenderness. There is no guarding or rebound.  Musculoskeletal:        General: No tenderness. Normal range of motion.     Cervical back: Normal range of motion and neck supple.  Lymphadenopathy:     Cervical: No cervical adenopathy.  Skin:    General: Skin is warm.     Findings: No rash.  Neurological:     Mental Status: He is alert and oriented to person, place, and time.     Cranial Nerves: No cranial nerve deficit.     Deep Tendon Reflexes: Reflexes are normal and symmetric.      Wt Readings from Last 3 Encounters:  09/21/19 159 lb (72.1 kg)  02/22/19 165 lb (74.8 kg)  01/26/19 164 lb (74.4 kg)    BP 130/80   Pulse 80   Ht 5\' 11"  (1.803 m)   Wt 159 lb (72.1 kg)   BMI 22.18 kg/m   Assessment and Plan: 1. Essential hypertension Chronic.  Controlled.  Stable.  Continue hydrochlorothiazide 12.5 mg once a day and losartan 50 mg once a day.  Will check renal function panel for electrolytes and GFR evaluation. - Renal Function Panel - hydrochlorothiazide (HYDRODIURIL) 12.5 MG tablet; Take 1 tablet (12.5 mg total) by mouth daily.  Dispense: 90 tablet;  Refill: 1 - losartan (COZAAR) 50 MG tablet; Take 1 tablet (50 mg total) by mouth daily.  Dispense: 90 tablet; Refill: 1  2. Cigarette nicotine dependence without complication Patient has been advised of the health risks of smoking and counseled concerning cessation of tobacco products. I spent over 3 minutes for discussion and to answer questions.  Continue with NicoDerm patches.  3. Centrilobular emphysema (HCC) Review of CT scan notes that there is centrilobular emphysema and patient is doing relatively well.  Patient has finally decided to quit smoking and is in the process of doing so.  4. Aortic atherosclerosis (HCC) Noted on CT scan for evaluation of lung status for multi year smoking history.  Will control with reduction of blood pressure evaluation of lipid and control if necessary and patient is to start low-dose aspirin 81 mg. - Lipid Panel With LDL/HDL Ratio - Renal Function Panel

## 2019-09-22 LAB — LIPID PANEL WITH LDL/HDL RATIO
Cholesterol, Total: 190 mg/dL (ref 100–199)
HDL: 36 mg/dL — ABNORMAL LOW (ref >39)
LDL Chol Calc (NIH): 126 mg/dL — ABNORMAL HIGH (ref 0–99)
LDL/HDL Ratio: 3.5 ratio (ref 0.0–3.6)
Triglycerides: 155 mg/dL — ABNORMAL HIGH (ref 0–149)
VLDL Cholesterol Cal: 28 mg/dL (ref 5–40)

## 2019-09-22 LAB — RENAL FUNCTION PANEL
Albumin: 4.7 g/dL (ref 3.8–4.8)
BUN/Creatinine Ratio: 12 (ref 10–24)
BUN: 11 mg/dL (ref 8–27)
CO2: 26 mmol/L (ref 20–29)
Calcium: 9.4 mg/dL (ref 8.6–10.2)
Chloride: 101 mmol/L (ref 96–106)
Creatinine, Ser: 0.93 mg/dL (ref 0.76–1.27)
GFR calc Af Amer: 101 mL/min/{1.73_m2} (ref >59)
GFR calc non Af Amer: 87 mL/min/{1.73_m2} (ref >59)
Glucose: 98 mg/dL (ref 65–99)
Phosphorus: 3.9 mg/dL (ref 2.8–4.1)
Potassium: 4.3 mmol/L (ref 3.5–5.2)
Sodium: 143 mmol/L (ref 134–144)

## 2019-09-22 NOTE — Patient Instructions (Signed)

## 2020-02-17 ENCOUNTER — Telehealth: Payer: Self-pay | Admitting: *Deleted

## 2020-02-17 NOTE — Telephone Encounter (Signed)
Contacted in attempt to schedule annual lung screening scan. Patient request to wait on scheduling at this time and to consider screening after Thanksgiving.

## 2020-03-06 ENCOUNTER — Telehealth: Payer: Self-pay | Admitting: *Deleted

## 2020-03-06 NOTE — Telephone Encounter (Signed)
Contacted in attempt to schedule lung screening scan. Patient request to consider scheduling scan in January.

## 2020-03-08 ENCOUNTER — Ambulatory Visit: Payer: BC Managed Care – PPO | Admitting: Family Medicine

## 2020-03-12 ENCOUNTER — Ambulatory Visit (INDEPENDENT_AMBULATORY_CARE_PROVIDER_SITE_OTHER): Payer: BC Managed Care – PPO | Admitting: Family Medicine

## 2020-03-12 ENCOUNTER — Other Ambulatory Visit: Payer: Self-pay

## 2020-03-12 ENCOUNTER — Encounter: Payer: Self-pay | Admitting: Family Medicine

## 2020-03-12 VITALS — BP 120/64 | HR 68 | Ht 71.0 in | Wt 156.0 lb

## 2020-03-12 DIAGNOSIS — E782 Mixed hyperlipidemia: Secondary | ICD-10-CM | POA: Diagnosis not present

## 2020-03-12 DIAGNOSIS — I700 Atherosclerosis of aorta: Secondary | ICD-10-CM

## 2020-03-12 DIAGNOSIS — Z23 Encounter for immunization: Secondary | ICD-10-CM | POA: Diagnosis not present

## 2020-03-12 DIAGNOSIS — I10 Essential (primary) hypertension: Secondary | ICD-10-CM

## 2020-03-12 DIAGNOSIS — R634 Abnormal weight loss: Secondary | ICD-10-CM | POA: Diagnosis not present

## 2020-03-12 MED ORDER — LOSARTAN POTASSIUM 50 MG PO TABS: 50.0000 mg | ORAL_TABLET | Freq: Every day | ORAL | 5 refills | Status: DC

## 2020-03-12 MED ORDER — HYDROCHLOROTHIAZIDE 12.5 MG PO TABS: 12.5000 mg | ORAL_TABLET | Freq: Every day | ORAL | 5 refills | Status: DC

## 2020-03-12 NOTE — Progress Notes (Signed)
Date:  03/12/2020   Name:  Bruce Kelley   DOB:  [birth year]   MRN:  [id number]   Chief Complaint: Hypertension and Flu Vaccine  Hypertension This is a chronic problem. The current episode started more than 1 year ago. The problem is controlled. Pertinent negatives include no anxiety, blurred vision, chest pain, headaches, malaise/fatigue, neck pain, orthopnea, palpitations, peripheral edema, PND, shortness of breath or sweats. There are no associated agents to hypertension. Risk factors for coronary artery disease include dyslipidemia. Past treatments include diuretics and angiotensin blockers. The current treatment provides moderate improvement. There are no compliance problems.  There is no history of angina, kidney disease, CAD/MI, CVA, heart failure, left ventricular hypertrophy, PVD or retinopathy. There is no history of chronic renal disease, a hypertension causing med or renovascular disease.    Lab Results  Component Value Date   CREATININE 0.93 09/21/2019   BUN 11 09/21/2019   NA 143 09/21/2019   K 4.3 09/21/2019   CL 101 09/21/2019   CO2 26 09/21/2019   Lab Results  Component Value Date   CHOL 190 09/21/2019   HDL 36 (L) 09/21/2019   LDLCALC 126 (H) 09/21/2019   TRIG 155 (H) 09/21/2019   Lab Results  Component Value Date   TSH 1.460 08/04/2017   No results found for: HGBA1C Lab Results  Component Value Date   WBC 11.6 (H) 08/04/2017   HGB 15.9 08/04/2017   HCT 46.0 08/04/2017   MCV 93 08/04/2017   PLT 283 08/04/2017   No results found for: ALT, AST, GGT, ALKPHOS, BILITOT   Review of Systems  Constitutional: Negative for chills, fever and malaise/fatigue.  HENT: Negative for drooling, ear discharge, ear pain and sore throat.   Eyes: Negative for blurred vision.  Respiratory: Negative for cough, shortness of breath and wheezing.   Cardiovascular: Negative for chest pain, palpitations, orthopnea, leg swelling and PND.  Gastrointestinal: Negative for  abdominal pain, blood in stool, constipation, diarrhea and nausea.  Endocrine: Negative for polydipsia.  Genitourinary: Negative for dysuria, frequency, hematuria and urgency.  Musculoskeletal: Negative for back pain, myalgias and neck pain.  Skin: Negative for rash.  Allergic/Immunologic: Negative for environmental allergies.  Neurological: Negative for dizziness and headaches.  Hematological: Does not bruise/bleed easily.  Psychiatric/Behavioral: Negative for suicidal ideas. The patient is not nervous/anxious.     Patient Active Problem List   Diagnosis Date Noted  . Screening for colon cancer   . Elevated blood pressure, situational 08/09/2014  . Routine general medical examination at a health care facility 08/09/2014  . 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 Torticollis 08/09/2014  . Acid reflux 08/09/2014    No Known Allergies  Past Surgical History:  Procedure Laterality Date  . APPENDECTOMY    . COLONOSCOPY WITH PROPOFOL N/A 09/30/2017   Procedure: COLONOSCOPY WITH PROPOFOL;  Surgeon: Lin Landsman, MD;  Location: [facility];  Service: Endoscopy;  Laterality: N/A;    Social History   Tobacco Use  . Smoking status: Current Every Day Smoker    Packs/day: 0.75    Years: 46.00    Pack years: 34.50    Types: Cigarettes  . Smokeless tobacco: Never Used  . Tobacco comment: gave info about ZYban and patches  Substance Use Topics  . Alcohol use: Yes    Alcohol/week: 0.0 standard drinks    Comment: occasionally  . Drug use: No     Medication list has been reviewed and updated.  Current Meds  Medication  Sig  . hydrochlorothiazide (HYDRODIURIL) 12.5 MG tablet Take 1 tablet (12.5 mg total) by mouth daily.  Marland Kitchen losartan (COZAAR) 50 MG tablet Take 1 tablet (50 mg total) by mouth daily.    PHQ 2/9 Scores 03/12/2020 09/21/2019 01/26/2019 08/04/2017  PHQ - 2 Score 0 0 0 0  PHQ- 9 Score 0 0 - 0    GAD 7 : Generalized Anxiety Score 03/12/2020 09/21/2019  Nervous,  Anxious, on Edge 0 0  Control/stop worrying 0 0  Worry too much - different things 0 0  Trouble relaxing 0 0  Restless 0 0  Easily annoyed or irritable 0 0  Afraid - awful might happen 0 0  Total GAD 7 Score 0 0    BP Readings from Last 3 Encounters:  03/12/20 120/64  09/21/19 130/80  01/26/19 119/78    Physical Exam Vitals and nursing note reviewed.  Constitutional:      Appearance: He is well-developed.  HENT:     Head: Normocephalic and atraumatic.     Right Ear: Tympanic membrane, ear canal and external ear normal.     Left Ear: Tympanic membrane, ear canal and external ear normal.     Nose: Nose normal.     Mouth/Throat:     Dentition: Normal dentition.  Eyes:     General: Lids are normal. No scleral icterus.    Conjunctiva/sclera: Conjunctivae normal.     Pupils: Pupils are equal, round, and reactive to light.  Neck:     Thyroid: No thyromegaly.     Vascular: No carotid bruit, hepatojugular reflux or JVD.     Trachea: No tracheal deviation.  Cardiovascular:     Rate and Rhythm: Normal rate and regular rhythm.     Heart sounds: Normal heart sounds.  Pulmonary:     Effort: Pulmonary effort is normal.     Breath sounds: Normal breath sounds.  Abdominal:     General: Bowel sounds are normal.     Palpations: Abdomen is soft. There is no hepatomegaly, splenomegaly or mass.     Tenderness: There is no abdominal tenderness.     Hernia: There is no hernia in the left inguinal area.  Genitourinary:    Testes: Normal.     Prostate: Normal.     Rectum: Normal.  Musculoskeletal:        General: Normal range of motion.     Cervical back: Normal range of motion and neck supple.  Lymphadenopathy:     Cervical: No cervical adenopathy.  Skin:    General: Skin is warm and dry.     Findings: No rash.  Neurological:     Mental Status: He is alert and oriented to person, place, and time.     Sensory: No sensory deficit.  Psychiatric:        Mood and Affect: Mood is not  anxious or depressed.     Wt Readings from Last 3 Encounters:  03/12/20 156 lb (70.8 kg)  09/21/19 159 lb (72.1 kg)  02/22/19 165 lb (74.8 kg)    BP 120/64   Pulse 68   Ht 5\' 11"  (1.803 m)   Wt 156 lb (70.8 kg)   BMI 21.76 kg/m   Assessment and Plan: 1. Essential hypertension Chronic.  Controlled.  Stable.  Patient's chart was reviewed for previous blood pressure readings.  Patient will continue hydrochlorothiazide 12.5 mg once a day as well as losartan 50 mg once a day.  Will check renal function panel for GFR and  electrolytes. - hydrochlorothiazide (HYDRODIURIL) 12.5 MG tablet; Take 1 tablet (12.5 mg total) by mouth daily.  Dispense: 30 tablet; Refill: 5 - losartan (COZAAR) 50 MG tablet; Take 1 tablet (50 mg total) by mouth daily.  Dispense: 30 tablet; Refill: 5 - Renal Function Panel  2. Moderate mixed hyperlipidemia not requiring statin therapy Chronic.  Controlled.  Stable.  Reviewed patient has had elevated triglycerides and LDLs in the past.  Will check lipid panel for current level. - Lipid Panel With LDL/HDL Ratio  3. Aortic atherosclerosis ([place]) As reviewed patient has aortic atherosclerosis and this will be treated with continued weight loss, lipid control, and blood pressure control.  4. Unintended weight loss Patient is noted that has had 2 consecutive visits over the past year which are his been weight loss associated.  Patient has attributed this to being more aware of what he is eating but no specific diet.  Given that there is a history of smoking patient has been encouraged to proceed with follow-up on his low-dose CT scans of the lung.  We will check hepatic function panel, CBC, and TSH for further evaluation of weight loss. - Hepatic Function Panel (6) - CBC with Differential/Platelet - TSH  5. Need for immunization against influenza Discussed and administered. - Flu Vaccine QUAD 36+ mos IM

## 2020-03-13 LAB — HEPATIC FUNCTION PANEL (6)
ALT: 17 IU/L (ref 0–44)
AST: 18 IU/L (ref 0–40)
Alkaline Phosphatase: 102 IU/L (ref 44–121)
Bilirubin Total: 0.4 mg/dL (ref 0.0–1.2)
Bilirubin, Direct: 0.13 mg/dL (ref 0.00–0.40)

## 2020-03-13 LAB — CBC WITH DIFFERENTIAL/PLATELET
Basophils Absolute: 0.2 10*3/uL (ref 0.0–0.2)
Basos: 1 %
EOS (ABSOLUTE): 0.3 10*3/uL (ref 0.0–0.4)
Eos: 3 %
Hematocrit: 47.8 % (ref 37.5–51.0)
Hemoglobin: 16.5 g/dL (ref 13.0–17.7)
Immature Grans (Abs): 0 10*3/uL (ref 0.0–0.1)
Immature Granulocytes: 0 %
Lymphocytes Absolute: 2.2 10*3/uL (ref 0.7–3.1)
Lymphs: 21 %
MCH: 32.4 pg (ref 26.6–33.0)
MCHC: 34.5 g/dL (ref 31.5–35.7)
MCV: 94 fL (ref 79–97)
Monocytes Absolute: 0.6 10*3/uL (ref 0.1–0.9)
Monocytes: 6 %
Neutrophils Absolute: 7.3 10*3/uL — ABNORMAL HIGH (ref 1.4–7.0)
Neutrophils: 69 %
Platelets: 269 10*3/uL (ref 150–450)
RBC: 5.1 x10E6/uL (ref 4.14–5.80)
RDW: 12.4 % (ref 11.6–15.4)
WBC: 10.6 10*3/uL (ref 3.4–10.8)

## 2020-03-13 LAB — LIPID PANEL WITH LDL/HDL RATIO
Cholesterol, Total: 175 mg/dL (ref 100–199)
HDL: 37 mg/dL — ABNORMAL LOW (ref >39)
LDL Chol Calc (NIH): 114 mg/dL — ABNORMAL HIGH (ref 0–99)
LDL/HDL Ratio: 3.1 ratio (ref 0.0–3.6)
Triglycerides: 133 mg/dL (ref 0–149)
VLDL Cholesterol Cal: 24 mg/dL (ref 5–40)

## 2020-03-13 LAB — RENAL FUNCTION PANEL
Albumin: 4.3 g/dL (ref 3.8–4.8)
BUN/Creatinine Ratio: 9 — ABNORMAL LOW (ref 10–24)
BUN: 8 mg/dL (ref 8–27)
CO2: 24 mmol/L (ref 20–29)
Calcium: 9.1 mg/dL (ref 8.6–10.2)
Chloride: 103 mmol/L (ref 96–106)
Creatinine, Ser: 0.91 mg/dL (ref 0.76–1.27)
GFR calc Af Amer: 103 mL/min/{1.73_m2} (ref >59)
GFR calc non Af Amer: 89 mL/min/{1.73_m2} (ref >59)
Glucose: 102 mg/dL — ABNORMAL HIGH (ref 65–99)
Phosphorus: 3.3 mg/dL (ref 2.8–4.1)
Potassium: 4.6 mmol/L (ref 3.5–5.2)
Sodium: 142 mmol/L (ref 134–144)

## 2020-03-13 LAB — TSH: TSH: 1.63 u[IU]/mL (ref 0.450–4.500)

## 2020-03-23 ENCOUNTER — Ambulatory Visit: Payer: Self-pay | Admitting: *Deleted

## 2020-03-23 NOTE — Telephone Encounter (Signed)
Message from Beverley Fiedler sent at 03/23/2020 4:51 PM EST  Summary: Clinical Advice   Patient just tested positive for covid with an at home test and wants to know what he needs to do moving forward. Please advise         Reason for Disposition  [1] COVID-19 diagnosed by positive lab test AND [2] mild symptoms (e.g., cough, fever, others) AND [8] no complications or SOB  Answer Assessment - Initial Assessment Questions 1. COVID-19 DIAGNOSIS: "Who made your Coronavirus (COVID-19) diagnosis?" "Was it confirmed by a positive lab test?" If not diagnosed by a HCP, ask "Are there lots of cases (community spread) where you live?" (See public health department website, if unsure)     Home test taken toay 2. COVID-19 EXPOSURE: "Was there any known exposure to COVID before the symptoms began?" CDC Definition of close contact: within 6 feet (2 meters) for a total of 15 minutes or more over a 24-hour period.      no 3. ONSET: "When did the COVID-19 symptoms start?"      Started about 4-5 days ago 4. WORST SYMPTOM: "What is your worst symptom?" (e.g., cough, fever, shortness of breath, muscle aches)     Not voiced 5. COUGH: "Do you have a cough?" If Yes, ask: "How bad is the cough?"      Not voiced 6. FEVER: "Do you have a fever?" If Yes, ask: "What is your temperature, how was it measured, and when did it start?"     no 7. RESPIRATORY STATUS: "Describe your breathing?" (e.g., shortness of breath, wheezing, unable to speak)      No difficulty breathing voiced 8. BETTER-SAME-WORSE: "Are you getting better, staying the same or getting worse compared to yesterday?"  If getting worse, ask, "In what way?"     same 9. HIGH RISK DISEASE: "Do you have any chronic medical problems?" (e.g., asthma, heart or lung disease, weak immune system, obesity, etc.)     Not assessed 10. PREGNANCY: "Is there any chance you are pregnant?" "When was your last menstrual period?"       n/a 11. OTHER SYMPTOMS: "Do you  have any other symptoms?"  (e.g., chills, fatigue, headache, loss of smell or taste, muscle pain, sore throat; new loss of smell or taste especially support the diagnosis of COVID-19)       Patient states that he has symptoms of a head cold  Protocols used: CORONAVIRUS (COVID-19) DIAGNOSED OR SUSPECTED-A-AH  Patient states he has been experiencing symptoms of a head cold for the past 4-5 days. Patient denies fever. Patient states that he tested positive for COVID using an at home test today. Self-isolation criteria and patient instructions reviewed. Patient advised to seek treatment in the ED if respiratory issues or distress develops. Patient advised that retesting was not recommended prior to 90 days of receiving initial positive results. Understanding verbalized.

## 2020-05-09 ENCOUNTER — Telehealth: Payer: Self-pay | Admitting: *Deleted

## 2020-05-09 NOTE — Telephone Encounter (Signed)
Attempted to contact patient for scheduling a lung screening scan.  Left a message that the last contact patient had they wanted to get a lung screening done in January or February.  Left telephone number of [phone] in order to contact Sean to get his low-dose CT screening set up for pt.

## 2020-05-17 ENCOUNTER — Telehealth: Payer: Self-pay

## 2020-05-17 NOTE — Telephone Encounter (Signed)
Attempted to contact patient for scheduling lung screening scan. Left message for patient to call [phone].

## 2020-05-23 ENCOUNTER — Telehealth: Payer: Self-pay | Admitting: *Deleted

## 2020-05-23 ENCOUNTER — Encounter: Payer: Self-pay | Admitting: *Deleted

## 2020-05-23 DIAGNOSIS — F17200 Nicotine dependence, unspecified, uncomplicated: Secondary | ICD-10-CM

## 2020-05-23 DIAGNOSIS — Z122 Encounter for screening for malignant neoplasm of respiratory organs: Secondary | ICD-10-CM

## 2020-05-23 DIAGNOSIS — Z87891 Personal history of nicotine dependence: Secondary | ICD-10-CM

## 2020-05-23 NOTE — Telephone Encounter (Signed)
Contacted patient. Patient will be scheduled for lung screening on 05/30/20 at 130 pm.  Patient is a current smoker. He smokes 1/2 pack per day x 47 years. He has no new medical conditions and denies coughing up blood or any shortness of breath.

## 2020-05-24 NOTE — Addendum Note (Signed)
Addended by: Lieutenant Diego on: 05/24/2020 07:44 AM   Modules accepted: Orders

## 2020-05-24 NOTE — Telephone Encounter (Signed)
Review of prior documented smoking hx, patient is a current smoker with a 35.75 pack year history.

## 2020-05-30 ENCOUNTER — Ambulatory Visit
Admission: RE | Admit: 2020-05-30 | Discharge: 2020-05-30 | Disposition: A | Discharge status: Home / Self Care | Payer: BC Managed Care – PPO | Source: Ambulatory Visit | Attending: Oncology | Admitting: Oncology

## 2020-05-30 ENCOUNTER — Other Ambulatory Visit: Payer: Self-pay

## 2020-05-30 DIAGNOSIS — F17200 Nicotine dependence, unspecified, uncomplicated: Secondary | ICD-10-CM | POA: Diagnosis present

## 2020-05-30 DIAGNOSIS — Z122 Encounter for screening for malignant neoplasm of respiratory organs: Secondary | ICD-10-CM

## 2020-05-30 DIAGNOSIS — Z87891 Personal history of nicotine dependence: Secondary | ICD-10-CM

## 2020-06-06 ENCOUNTER — Encounter: Payer: Self-pay | Admitting: *Deleted

## 2020-07-23 ENCOUNTER — Other Ambulatory Visit: Payer: Self-pay

## 2020-07-23 ENCOUNTER — Ambulatory Visit (INDEPENDENT_AMBULATORY_CARE_PROVIDER_SITE_OTHER): Payer: BC Managed Care – PPO | Admitting: Family Medicine

## 2020-07-23 ENCOUNTER — Encounter: Payer: Self-pay | Admitting: Family Medicine

## 2020-07-23 VITALS — BP 130/80 | HR 64 | Ht 71.0 in | Wt 151.0 lb

## 2020-07-23 DIAGNOSIS — R0789 Other chest pain: Secondary | ICD-10-CM | POA: Diagnosis not present

## 2020-07-23 DIAGNOSIS — M545 Low back pain, unspecified: Secondary | ICD-10-CM

## 2020-07-23 LAB — POCT URINALYSIS DIPSTICK
Bilirubin, UA: NEGATIVE
Blood, UA: NEGATIVE
Glucose, UA: NEGATIVE
Ketones, UA: NEGATIVE
Leukocytes, UA: NEGATIVE
Nitrite, UA: NEGATIVE
Protein, UA: NEGATIVE
Spec Grav, UA: 1.01 (ref 1.010–1.025)
Urobilinogen, UA: 0.2 E.U./dL
pH, UA: 6 (ref 5.0–8.0)

## 2020-07-23 MED ORDER — MELOXICAM 15 MG PO TABS
15.0000 mg | ORAL_TABLET | Freq: Every day | ORAL | 0 refills | Status: DC
Start: 2020-07-23 — End: 2020-08-03

## 2020-07-23 NOTE — Progress Notes (Signed)
Date:  07/23/2020   Name:  Bruce Kelley   DOB:  03-23-1956   MRN:  [id number]   Chief Complaint: Back Pain (L) lower back- hurts when you "press on it")  Back Pain This is a new problem. The current episode started in the past 7 days. The problem occurs daily. The problem has been waxing and waning since onset. The pain is present in the thoracic spine. The quality of the pain is described as aching. The pain is at a severity of 9/10. Pertinent negatives include no abdominal pain, bladder incontinence, bowel incontinence, chest pain, dysuria, fever, headaches, leg pain, numbness, paresis, paresthesias, pelvic pain, perianal numbness, tingling, weakness or weight loss.    Lab Results  Component Value Date   CREATININE 0.91 03/12/2020   BUN 8 03/12/2020   NA 142 03/12/2020   K 4.6 03/12/2020   CL 103 03/12/2020   CO2 24 03/12/2020   Lab Results  Component Value Date   CHOL 175 03/12/2020   HDL 37 (L) 03/12/2020   LDLCALC 114 (H) 03/12/2020   TRIG 133 03/12/2020   Lab Results  Component Value Date   TSH 1.630 03/12/2020   No results found for: HGBA1C Lab Results  Component Value Date   WBC 10.6 03/12/2020   HGB 16.5 03/12/2020   HCT 47.8 03/12/2020   MCV 94 03/12/2020   PLT 269 03/12/2020   Lab Results  Component Value Date   ALT 17 03/12/2020   AST 18 03/12/2020   ALKPHOS 102 03/12/2020   BILITOT 0.4 03/12/2020     Review of Systems  Constitutional: Negative for chills, fever and weight loss.  HENT: Negative for drooling, ear discharge, ear pain and sore throat.   Respiratory: Negative for cough, shortness of breath and wheezing.   Cardiovascular: Negative for chest pain, palpitations and leg swelling.  Gastrointestinal: Negative for abdominal pain, blood in stool, bowel incontinence, constipation, diarrhea and nausea.  Endocrine: Negative for polydipsia.  Genitourinary: Negative for bladder incontinence, dysuria, frequency, hematuria, pelvic pain and  urgency.  Musculoskeletal: Positive for back pain. Negative for myalgias and neck pain.  Skin: Negative for rash.  Allergic/Immunologic: Negative for environmental allergies.  Neurological: Negative for dizziness, tingling, weakness, numbness, headaches and paresthesias.  Hematological: Does not bruise/bleed easily.  Psychiatric/Behavioral: Negative for suicidal ideas. The patient is not nervous/anxious.     Patient Active Problem List   Diagnosis Date Noted  . Screening for colon cancer   . Elevated blood pressure, situational 08/09/2014  . Routine general medical examination at a health care facility 08/09/2014  . 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 Torticollis 08/09/2014  . Acid reflux 08/09/2014    No Known Allergies  Past Surgical History:  Procedure Laterality Date  . APPENDECTOMY    . COLONOSCOPY WITH PROPOFOL N/A 09/30/2017   Procedure: COLONOSCOPY WITH PROPOFOL;  Surgeon: Lin Landsman, MD;  Location: [facility];  Service: Endoscopy;  Laterality: N/A;    Social History   Tobacco Use  . Smoking status: Current Every Day Smoker    Packs/day: 0.75    Years: 47.00    Pack years: 35.25    Types: Cigarettes  . Smokeless tobacco: Never Used  . Tobacco comment: gave info about ZYban and patches  Substance Use Topics  . Alcohol use: Yes    Alcohol/week: 0.0 standard drinks    Comment: occasionally  . Drug use: No     Medication list has been reviewed and updated.  Current  Meds  Medication Sig  . hydrochlorothiazide (HYDRODIURIL) 12.5 MG tablet Take 1 tablet (12.5 mg total) by mouth daily.  Marland Kitchen losartan (COZAAR) 50 MG tablet Take 1 tablet (50 mg total) by mouth daily.    PHQ 2/9 Scores 07/23/2020 03/12/2020 09/21/2019 01/26/2019  PHQ - 2 Score 0 0 0 0  PHQ- 9 Score 0 0 0 -    GAD 7 : Generalized Anxiety Score 07/23/2020 03/12/2020 09/21/2019  Nervous, Anxious, on Edge 0 0 0  Control/stop worrying 0 0 0  Worry too much - different things 0 0 0  Trouble  relaxing 0 0 0  Restless 0 0 0  Easily annoyed or irritable 0 0 0  Afraid - awful might happen 0 0 0  Total GAD 7 Score 0 0 0    BP Readings from Last 3 Encounters:  07/23/20 130/80  03/12/20 120/64  09/21/19 130/80    Physical Exam Vitals and nursing note reviewed.  HENT:     Head: Normocephalic.     Right Ear: Tympanic membrane, ear canal and external ear normal.     Left Ear: Tympanic membrane, ear canal and external ear normal.     Nose: Nose normal. No congestion or rhinorrhea.     Mouth/Throat:     Mouth: Mucous membranes are moist.  Eyes:     General: No scleral icterus.       Right eye: No discharge.        Left eye: No discharge.     Conjunctiva/sclera: Conjunctivae normal.     Pupils: Pupils are equal, round, and reactive to light.  Neck:     Thyroid: No thyromegaly.     Vascular: No JVD.     Trachea: No tracheal deviation.  Cardiovascular:     Rate and Rhythm: Normal rate and regular rhythm.     Heart sounds: Normal heart sounds. No murmur heard. No friction rub. No gallop.   Pulmonary:     Effort: No respiratory distress.     Breath sounds: Normal breath sounds. No stridor. No decreased breath sounds, wheezing, rhonchi or rales.  Chest:     Chest wall: Tenderness present. No mass.  Breasts:     Right: No supraclavicular adenopathy.     Left: No supraclavicular adenopathy.      Comments: 11th 12th/left Abdominal:     General: Bowel sounds are normal.     Palpations: Abdomen is soft. There is no mass.     Tenderness: There is no abdominal tenderness. There is no guarding or rebound.  Musculoskeletal:        General: No tenderness. Normal range of motion.     Cervical back: Normal range of motion and neck supple.  Lymphadenopathy:     Cervical: No cervical adenopathy.     Upper Body:     Right upper body: No supraclavicular adenopathy.     Left upper body: No supraclavicular adenopathy.  Skin:    General: Skin is warm.     Findings: No rash.   Neurological:     Mental Status: He is alert and oriented to person, place, and time.     Cranial Nerves: No cranial nerve deficit.     Deep Tendon Reflexes: Reflexes are normal and symmetric.     Wt Readings from Last 3 Encounters:  07/23/20 151 lb (68.5 kg)  05/30/20 156 lb (70.8 kg)  03/12/20 156 lb (70.8 kg)    BP 130/80   Pulse 64   Ht 5'  11" (1.803 m)   Wt 151 lb (68.5 kg)   BMI 21.06 kg/m   Assessment and Plan:  1. Acute left-sided low back pain without sciatica New onset.  Persistent.  Relatively stable.  Exquisite tenderness over the lateral aspect of the 10th and 12th left ribs with corresponding intercostal muscles and oblique.  This is consistent either with a strain versus perhaps a fractured/cracked rib.  Patient was initiated on meloxicam 15 mg once a day.  Patient's been instructed to return if pain is unresolved.  There was no rash and there was no pleural friction rub nor was there any CVA tenderness. - POCT Urinalysis Dipstick - meloxicam (MOBIC) 15 MG tablet; Take 1 tablet (15 mg total) by mouth daily.  Dispense: 30 tablet; Refill: 0  2. Chest wall pain exam is more consistent with chest wall thoracic pain than lumbar pain as noted above is either of the rib nature or muscular nature there is no hematuria to suggest nephrolithiasis.

## 2020-08-02 ENCOUNTER — Other Ambulatory Visit: Payer: Self-pay

## 2020-08-02 DIAGNOSIS — I10 Essential (primary) hypertension: Secondary | ICD-10-CM

## 2020-08-02 MED ORDER — LOSARTAN POTASSIUM 50 MG PO TABS: 50.0000 mg | ORAL_TABLET | Freq: Every day | ORAL | 1 refills | Status: DC

## 2020-08-03 ENCOUNTER — Other Ambulatory Visit: Payer: Self-pay

## 2020-08-03 ENCOUNTER — Telehealth: Payer: Self-pay

## 2020-08-03 NOTE — Telephone Encounter (Signed)
Pt told to stop meloxicam and start Zyrtec in the am and Benadryl in the pm for hives

## 2020-08-03 NOTE — Telephone Encounter (Unsigned)
Copied from [place] [id number]. Topic: General - Other >> Aug 03, 2020 10:19 AM Leward Quan A wrote: Reason for CRM: Patient called in asking Baxter Flattery to please give him a call back say he may have hives and need to talk to her. No other information given please call  Ph# [phone]

## 2020-08-18 ENCOUNTER — Other Ambulatory Visit: Payer: Self-pay | Admitting: Family Medicine

## 2020-08-18 DIAGNOSIS — M545 Low back pain, unspecified: Secondary | ICD-10-CM

## 2020-09-10 ENCOUNTER — Encounter: Payer: Self-pay | Admitting: Family Medicine

## 2020-09-10 ENCOUNTER — Other Ambulatory Visit: Payer: Self-pay

## 2020-09-10 ENCOUNTER — Ambulatory Visit (INDEPENDENT_AMBULATORY_CARE_PROVIDER_SITE_OTHER): Payer: BC Managed Care – PPO | Admitting: Family Medicine

## 2020-09-10 VITALS — BP 124/80 | HR 74 | Ht 71.0 in | Wt 152.0 lb

## 2020-09-10 DIAGNOSIS — E782 Mixed hyperlipidemia: Secondary | ICD-10-CM

## 2020-09-10 DIAGNOSIS — F17210 Nicotine dependence, cigarettes, uncomplicated: Secondary | ICD-10-CM | POA: Diagnosis not present

## 2020-09-10 DIAGNOSIS — R351 Nocturia: Secondary | ICD-10-CM

## 2020-09-10 DIAGNOSIS — R198 Other specified symptoms and signs involving the digestive system and abdomen: Secondary | ICD-10-CM

## 2020-09-10 DIAGNOSIS — I700 Atherosclerosis of aorta: Secondary | ICD-10-CM

## 2020-09-10 DIAGNOSIS — I10 Essential (primary) hypertension: Secondary | ICD-10-CM | POA: Diagnosis not present

## 2020-09-10 DIAGNOSIS — Z0000 Encounter for general adult medical examination without abnormal findings: Secondary | ICD-10-CM | POA: Diagnosis not present

## 2020-09-10 DIAGNOSIS — Z1211 Encounter for screening for malignant neoplasm of colon: Secondary | ICD-10-CM

## 2020-09-10 LAB — HEMOCCULT GUIAC POC 1CARD (OFFICE): Fecal Occult Blood, POC: NEGATIVE

## 2020-09-10 MED ORDER — HYDROCHLOROTHIAZIDE 12.5 MG PO TABS: 12.5000 mg | ORAL_TABLET | Freq: Every day | ORAL | 1 refills | Status: DC

## 2020-09-10 MED ORDER — LOSARTAN POTASSIUM 50 MG PO TABS: 50.0000 mg | ORAL_TABLET | Freq: Every day | ORAL | 1 refills | Status: DC

## 2020-09-10 NOTE — Addendum Note (Signed)
Addended by: Fredderick Severance on: 09/10/2020 09:48 AM   Modules accepted: Orders

## 2020-09-10 NOTE — Progress Notes (Addendum)
Date:  09/10/2020   Name:  Bruce Kelley   DOB:  14-Oct-1955   MRN:  [id number]   Chief Complaint: Annual Exam and Hypertension  Patient is a 65 year old male who presents for a comprehensive physical exam. The patient reports the following problems: none. Health maintenance has been reviewed up to date.  Hypertension This is a chronic problem. The current episode started more than 1 year ago. The problem has been gradually improving since onset. The problem is controlled. Pertinent negatives include no anxiety, blurred vision, chest pain, headaches, malaise/fatigue, neck pain, orthopnea, palpitations, peripheral edema, PND, shortness of breath or sweats. There are no associated agents to hypertension. Past treatments include diuretics and angiotensin blockers. The current treatment provides moderate improvement. There are no compliance problems.  There is no history of angina, kidney disease, CAD/MI, CVA, heart failure, left ventricular hypertrophy, PVD or retinopathy. There is no history of chronic renal disease, a hypertension causing med or renovascular disease.    Lab Results  Component Value Date   CREATININE 0.91 03/12/2020   BUN 8 03/12/2020   NA 142 03/12/2020   K 4.6 03/12/2020   CL 103 03/12/2020   CO2 24 03/12/2020   Lab Results  Component Value Date   CHOL 175 03/12/2020   HDL 37 (L) 03/12/2020   LDLCALC 114 (H) 03/12/2020   TRIG 133 03/12/2020   Lab Results  Component Value Date   TSH 1.630 03/12/2020   No results found for: HGBA1C Lab Results  Component Value Date   WBC 10.6 03/12/2020   HGB 16.5 03/12/2020   HCT 47.8 03/12/2020   MCV 94 03/12/2020   PLT 269 03/12/2020   Lab Results  Component Value Date   ALT 17 03/12/2020   AST 18 03/12/2020   ALKPHOS 102 03/12/2020   BILITOT 0.4 03/12/2020     Review of Systems  Constitutional: Negative for chills, fever and malaise/fatigue.  HENT: Negative for drooling, ear discharge, ear pain and sore  throat.   Eyes: Negative for blurred vision.  Respiratory: Negative for cough, shortness of breath and wheezing.   Cardiovascular: Negative for chest pain, palpitations, orthopnea, leg swelling and PND.  Gastrointestinal: Negative for abdominal pain, blood in stool, constipation, diarrhea and nausea.  Endocrine: Negative for polydipsia.  Genitourinary: Negative for dysuria, frequency, hematuria and urgency.  Musculoskeletal: Negative for back pain, myalgias and neck pain.  Skin: Negative for rash.  Allergic/Immunologic: Negative for environmental allergies.  Neurological: Negative for dizziness and headaches.  Hematological: Does not bruise/bleed easily.  Psychiatric/Behavioral: Negative for suicidal ideas. The patient is not nervous/anxious.     Patient Active Problem List   Diagnosis Date Noted  . Screening for colon cancer   . Elevated blood pressure, situational 08/09/2014  . Routine general medical examination at a health care facility 08/09/2014  . 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 Torticollis 08/09/2014  . Acid reflux 08/09/2014    Allergies  Allergen Reactions  . Meloxicam     Hives/ rash    Past Surgical History:  Procedure Laterality Date  . APPENDECTOMY    . COLONOSCOPY WITH PROPOFOL N/A 09/30/2017   Procedure: COLONOSCOPY WITH PROPOFOL;  Surgeon: Lin Landsman, MD;  Location: [facility];  Service: Endoscopy;  Laterality: N/A;    Social History   Tobacco Use  . Smoking status: Current Every Day Smoker    Packs/day: 0.75    Years: 47.00    Pack years: 35.25    Types:  Cigarettes  . Smokeless tobacco: Never Used  . Tobacco comment: gave info about ZYban and patches  Substance Use Topics  . Alcohol use: Yes    Alcohol/week: 0.0 standard drinks    Comment: occasionally  . Drug use: No     Medication list has been reviewed and updated.  Current Meds  Medication Sig  . hydrochlorothiazide (HYDRODIURIL) 12.5 MG tablet Take 1 tablet (12.5 mg  total) by mouth daily.  Marland Kitchen losartan (COZAAR) 50 MG tablet Take 1 tablet (50 mg total) by mouth daily.    PHQ 2/9 Scores 09/10/2020 07/23/2020 03/12/2020 09/21/2019  PHQ - 2 Score 0 0 0 0  PHQ- 9 Score 0 0 0 0    GAD 7 : Generalized Anxiety Score 09/10/2020 07/23/2020 03/12/2020 09/21/2019  Nervous, Anxious, on Edge 0 0 0 0  Control/stop worrying 0 0 0 0  Worry too much - different things 0 0 0 0  Trouble relaxing 0 0 0 0  Restless 0 0 0 0  Easily annoyed or irritable 0 0 0 0  Afraid - awful might happen 0 0 0 0  Total GAD 7 Score 0 0 0 0    BP Readings from Last 3 Encounters:  09/10/20 124/80  07/23/20 130/80  03/12/20 120/64    Physical Exam Vitals and nursing note reviewed.  Constitutional:      Appearance: Normal appearance. He is well-developed and normal weight.  HENT:     Head: Normocephalic.     Right Ear: Hearing, tympanic membrane, ear canal and external ear normal.     Left Ear: Hearing, tympanic membrane, ear canal and external ear normal.     Nose: Nose normal.     Mouth/Throat:     Lips: Pink.     Mouth: Mucous membranes are moist.     Dentition: Normal dentition.     Tongue: No lesions.     Palate: No mass.     Pharynx: Oropharynx is clear. Uvula midline. No pharyngeal swelling, oropharyngeal exudate, posterior oropharyngeal erythema or uvula swelling.     Tonsils: No tonsillar exudate.  Eyes:     General: Lids are normal. Vision grossly intact. Gaze aligned appropriately. No scleral icterus.       Right eye: No discharge.        Left eye: No discharge.     Extraocular Movements: Extraocular movements intact.     Conjunctiva/sclera: Conjunctivae normal.     Pupils: Pupils are equal, round, and reactive to light.  Neck:     Thyroid: No thyroid mass, thyromegaly or thyroid tenderness.     Vascular: Normal carotid pulses. No carotid bruit, hepatojugular reflux or JVD.     Trachea: Trachea and phonation normal. No tracheal tenderness or tracheal deviation.   Cardiovascular:     Rate and Rhythm: Normal rate and regular rhythm.     Chest Wall: PMI is not displaced. No thrill.     Pulses: Normal pulses. No decreased pulses.          Carotid pulses are 2+ on the right side and 2+ on the left side.      Radial pulses are 2+ on the right side and 2+ on the left side.       Femoral pulses are 2+ on the right side and 2+ on the left side.      Popliteal pulses are 2+ on the right side and 2+ on the left side.       Dorsalis pedis pulses  are 2+ on the right side and 2+ on the left side.       Posterior tibial pulses are 2+ on the right side and 2+ on the left side.     Heart sounds: Normal heart sounds, S1 normal and S2 normal. No murmur heard.  No systolic murmur is present.  No diastolic murmur is present. No friction rub. No gallop. No S3 or S4 sounds.   Pulmonary:     Effort: Pulmonary effort is normal. No respiratory distress.     Breath sounds: Normal breath sounds. No stridor. No decreased breath sounds, wheezing, rhonchi or rales.  Chest:     Chest wall: No mass or tenderness.  Breasts: Breasts are symmetrical.     Right: Normal. No axillary adenopathy or supraclavicular adenopathy.     Left: Normal. No axillary adenopathy or supraclavicular adenopathy.    Abdominal:     General: Bowel sounds are normal.     Palpations: Abdomen is soft. There is hepatomegaly. There is no shifting dullness, splenomegaly or mass.     Tenderness: There is no abdominal tenderness. There is no right CVA tenderness, left CVA tenderness, guarding or rebound.     Hernia: There is no hernia in the umbilical area, ventral area, left inguinal area or right inguinal area.  Genitourinary:    Penis: Normal and circumcised.      Testes: Normal. Cremasteric reflex is present.        Right: Mass, tenderness or swelling not present.        Left: Mass, tenderness or swelling not present.     Epididymis:     Right: Normal.     Left: Normal.     Prostate: Normal. Not  enlarged, not tender and no nodules present.     Rectum: Normal. Guaiac result negative. No mass.  Musculoskeletal:        General: No tenderness. Normal range of motion.     Cervical back: Full passive range of motion without pain, normal range of motion and neck supple.     Right lower leg: No edema.     Left lower leg: No edema.  Lymphadenopathy:     Head:     Right side of head: No submandibular or tonsillar adenopathy.     Left side of head: No submandibular or tonsillar adenopathy.     Cervical: No cervical adenopathy.     Right cervical: No superficial, deep or posterior cervical adenopathy.    Left cervical: No superficial, deep or posterior cervical adenopathy.     Upper Body:     Right upper body: No supraclavicular or axillary adenopathy.     Left upper body: No supraclavicular or axillary adenopathy.     Lower Body: No right inguinal adenopathy. No left inguinal adenopathy.  Skin:    General: Skin is warm.     Capillary Refill: Capillary refill takes less than 2 seconds.     Findings: No bruising, erythema or rash.  Neurological:     General: No focal deficit present.     Mental Status: He is alert and oriented to person, place, and time.     Cranial Nerves: No cranial nerve deficit.     Sensory: Sensation is intact.     Motor: Motor function is intact.     Coordination: Coordination is intact.     Gait: Gait is intact.     Deep Tendon Reflexes: Reflexes are normal and symmetric.     Reflex Scores:  Tricep reflexes are 2+ on the right side and 2+ on the left side.      Bicep reflexes are 2+ on the right side and 2+ on the left side.      Brachioradialis reflexes are 2+ on the right side and 2+ on the left side.      Patellar reflexes are 2+ on the right side and 2+ on the left side.      Achilles reflexes are 2+ on the right side and 2+ on the left side. Psychiatric:        Behavior: Behavior is cooperative.     Wt Readings from Last 3 Encounters:  09/10/20  152 lb (68.9 kg)  07/23/20 151 lb (68.5 kg)  05/30/20 156 lb (70.8 kg)    BP 124/80   Pulse 74   Ht 5\' 11"  (1.803 m)   Wt 152 lb (68.9 kg)   BMI 21.20 kg/m   Assessment and Plan: Bruce Kelley is a 65 y.o. male who presents today for his Complete Annual Exam. He feels well. He reports exercising . He reports he is sleeping well.  Patient's chart was reviewed for previous encounters most recent labs and most recent imaging and care everywhere. 1. Annual physical exam Immunizations are reviewed and recommendations provided.   Age appropriate screening tests are discussed. Counseling given for risk factor reduction interventions.  No subjective/objective concerns noted during history and physical exam.  We will check CBC and Hemoccult was noted to be negative. - CBC with Differential/Platelet - POCT Occult Blood Stool  2. Essential hypertension Chronic.  Controlled.  Stable.  Blood pressure is 124/80.  We will continue losartan 50 mg once a day and hydrochlorothiazide 12.5 mg once a day. - losartan (COZAAR) 50 MG tablet; Take 1 tablet (50 mg total) by mouth daily.  Dispense: 90 tablet; Refill: 1 - hydrochlorothiazide (HYDRODIURIL) 12.5 MG tablet; Take 1 tablet (12.5 mg total) by mouth daily.  Dispense: 90 tablet; Refill: 1 - Renal Function Panel  3. Palpable tender liver During exam was noted that patient had a palpable liver edge that was tender.  We will get hepatic function panel along with CBC and ultrasound of the right upper quadrant to assess for hepatomegaly. - Hepatic Function Panel (6) - US Abdomen Limited RUQ (LIVER/GB); Future - CBC with Differential/Platelet  4. Cigarette nicotine dependence without complication Patient currently has a patch on and is tolerating well however he still has significant desires to smoke.  5. Aortic atherosclerosis ([place]) To reduce risk of further escalation of atherosclerosis we are controlling blood pressure, lipids, and smoking is being  dealt with at this time. - Lipid Panel With LDL/HDL Ratio  6. Moderate mixed hyperlipidemia not requiring statin therapy Chronic.  Episodic.  We will check lipid panel for current level. - Lipid Panel With LDL/HDL Ratio  7. Nocturia And has occasional nocturia DRE was noted to be normal today we will check PSA. - PSA  8. Colon cancer screening Discussed with patient and gastroenterology was consulted for upcoming colonoscopy and this month. - Ambulatory referral to Gastroenterology

## 2020-09-11 LAB — RENAL FUNCTION PANEL
Albumin: 4.2 g/dL (ref 3.8–4.8)
BUN/Creatinine Ratio: 11 (ref 10–24)
BUN: 11 mg/dL (ref 8–27)
CO2: 24 mmol/L (ref 20–29)
Calcium: 9.4 mg/dL (ref 8.6–10.2)
Chloride: 102 mmol/L (ref 96–106)
Creatinine, Ser: 1.02 mg/dL (ref 0.76–1.27)
Glucose: 101 mg/dL — ABNORMAL HIGH (ref 65–99)
Phosphorus: 3.3 mg/dL (ref 2.8–4.1)
Potassium: 4.4 mmol/L (ref 3.5–5.2)
Sodium: 140 mmol/L (ref 134–144)
eGFR: 82 mL/min/{1.73_m2} (ref >59)

## 2020-09-11 LAB — CBC WITH DIFFERENTIAL/PLATELET
Basophils Absolute: 0.2 10*3/uL (ref 0.0–0.2)
Basos: 2 %
EOS (ABSOLUTE): 0.4 10*3/uL (ref 0.0–0.4)
Eos: 3 %
Hematocrit: 46.9 % (ref 37.5–51.0)
Hemoglobin: 16.2 g/dL (ref 13.0–17.7)
Immature Grans (Abs): 0 10*3/uL (ref 0.0–0.1)
Immature Granulocytes: 0 %
Lymphocytes Absolute: 2.8 10*3/uL (ref 0.7–3.1)
Lymphs: 23 %
MCH: 31.6 pg (ref 26.6–33.0)
MCHC: 34.5 g/dL (ref 31.5–35.7)
MCV: 91 fL (ref 79–97)
Monocytes Absolute: 0.7 10*3/uL (ref 0.1–0.9)
Monocytes: 6 %
Neutrophils Absolute: 7.8 10*3/uL — ABNORMAL HIGH (ref 1.4–7.0)
Neutrophils: 66 %
Platelets: 282 10*3/uL (ref 150–450)
RBC: 5.13 x10E6/uL (ref 4.14–5.80)
RDW: 12.8 % (ref 11.6–15.4)
WBC: 11.9 10*3/uL — ABNORMAL HIGH (ref 3.4–10.8)

## 2020-09-11 LAB — LIPID PANEL WITH LDL/HDL RATIO
Cholesterol, Total: 167 mg/dL (ref 100–199)
HDL: 37 mg/dL — ABNORMAL LOW (ref >39)
LDL Chol Calc (NIH): 115 mg/dL — ABNORMAL HIGH (ref 0–99)
LDL/HDL Ratio: 3.1 ratio (ref 0.0–3.6)
Triglycerides: 79 mg/dL (ref 0–149)
VLDL Cholesterol Cal: 15 mg/dL (ref 5–40)

## 2020-09-11 LAB — PSA: Prostate Specific Ag, Serum: 1.3 ng/mL (ref 0.0–4.0)

## 2020-09-11 LAB — HEPATIC FUNCTION PANEL (6)
ALT: 14 IU/L (ref 0–44)
AST: 13 IU/L (ref 0–40)
Alkaline Phosphatase: 113 IU/L (ref 44–121)
Bilirubin Total: 0.3 mg/dL (ref 0.0–1.2)
Bilirubin, Direct: <0.1 mg/dL (ref 0.00–0.40)

## 2020-09-17 ENCOUNTER — Other Ambulatory Visit: Payer: Self-pay | Admitting: Family Medicine

## 2020-09-17 DIAGNOSIS — M545 Low back pain, unspecified: Secondary | ICD-10-CM

## 2020-09-17 NOTE — Telephone Encounter (Signed)
   Notes to clinic: medication requested is not on current list Shows that medication was d/c   Requested Prescriptions  Pending Prescriptions Disp Refills   meloxicam (MOBIC) 15 MG tablet [Pharmacy Med Name: Meloxicam 15 MG Oral Tablet] 30 tablet 0    Sig: Take 1 tablet by mouth once daily      Analgesics:  COX2 Inhibitors Passed - 09/17/2020  6:20 AM      Passed - HGB in normal range and within 360 days    Hemoglobin  Date Value Ref Range Status  09/10/2020 16.2 13.0 - 17.7 g/dL Final          Passed - Cr in normal range and within 360 days    Creatinine, Ser  Date Value Ref Range Status  09/10/2020 1.02 0.76 - 1.27 mg/dL Final          Passed - Patient is not pregnant      Passed - Valid encounter within last 12 months    Recent Outpatient Visits           1 week ago Annual physical exam   [place], MD   1 month ago Chest wall pain   [facility] Juline Patch, MD   6 months ago Essential hypertension   [place], Deanna C, MD   12 months ago Essential hypertension   [place], Deanna C, MD   1 year ago Essential hypertension   [place], Deanna C, MD       Future Appointments             In 5 months Juline Patch, MD [facility], [place]   In 12 months Juline Patch, MD [facility], [facility]

## 2020-09-19 ENCOUNTER — Ambulatory Visit: Payer: BC Managed Care – PPO

## 2020-10-15 ENCOUNTER — Other Ambulatory Visit: Payer: Self-pay

## 2020-10-15 ENCOUNTER — Ambulatory Visit
Admission: RE | Admit: 2020-10-15 | Discharge: 2020-10-15 | Disposition: A | Discharge status: Home / Self Care | Payer: BC Managed Care – PPO | Source: Ambulatory Visit | Attending: Family Medicine | Admitting: Family Medicine

## 2020-10-15 DIAGNOSIS — R198 Other specified symptoms and signs involving the digestive system and abdomen: Secondary | ICD-10-CM | POA: Diagnosis not present

## 2020-12-31 IMAGING — CT CT CHEST LUNG CANCER SCREENING LOW DOSE W/O CM
2 of 5 series · 15 of 40 positions shown, 18 images · non-contrast
Comparison: None.

CLINICAL DATA: 62-year-old male current smoker, with 35 pack-year
history of smoking, for initial lung cancer screening

EXAM:
CT CHEST WITHOUT CONTRAST LOW-DOSE FOR LUNG CANCER SCREENING
TECHNIQUE: Multidetector CT imaging of the chest was performed following the
standard protocol without IV contrast.

[Series 3: lung 1.00 · axial · 0.68mm/px · z∈[-1211,-899]mm · 12 of 346 slices shown, 15 images]
[im 17/346  mediastinal]
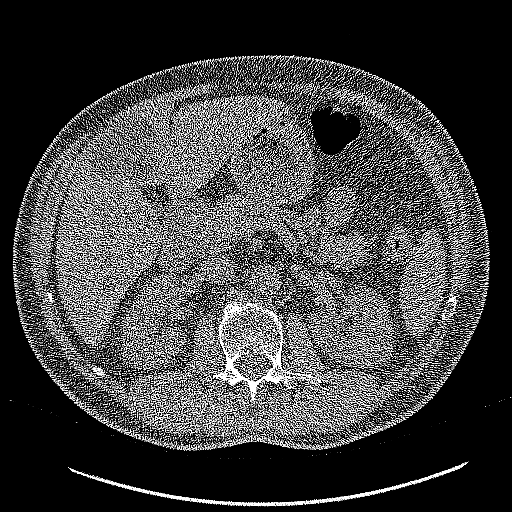
[im 17/346  lung]
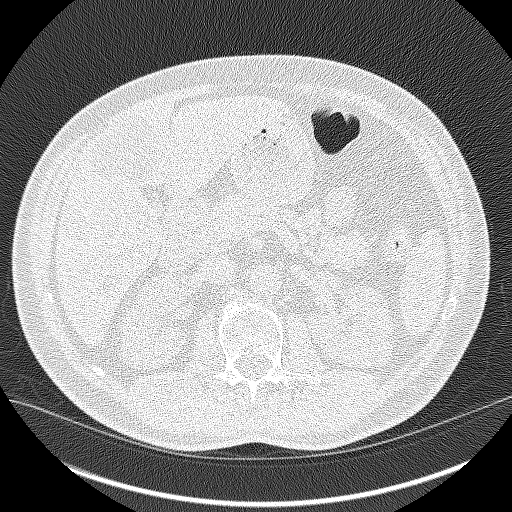
[im 50/346  lung]
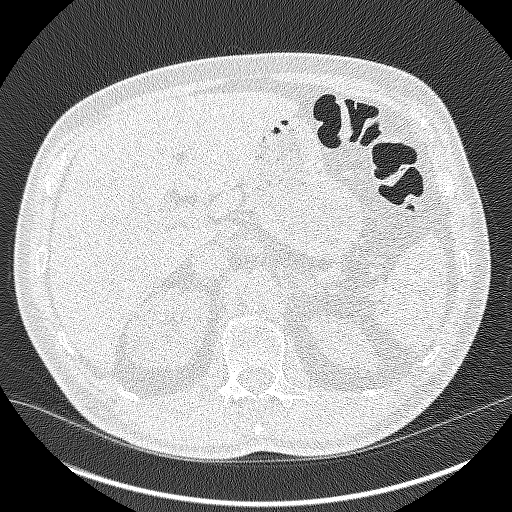
[im 83/346  lung]
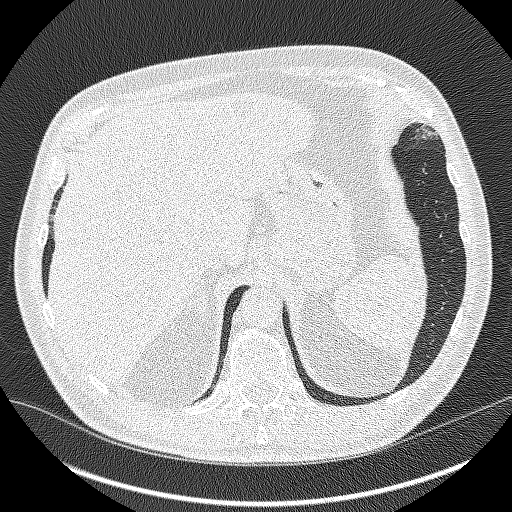
[im 99/346  lung]
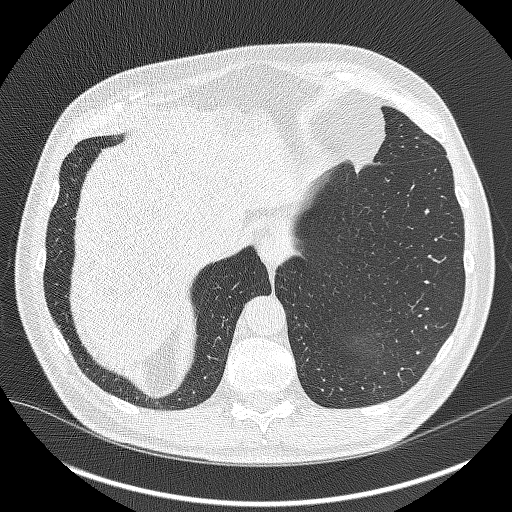
[im 132/346  mediastinal]
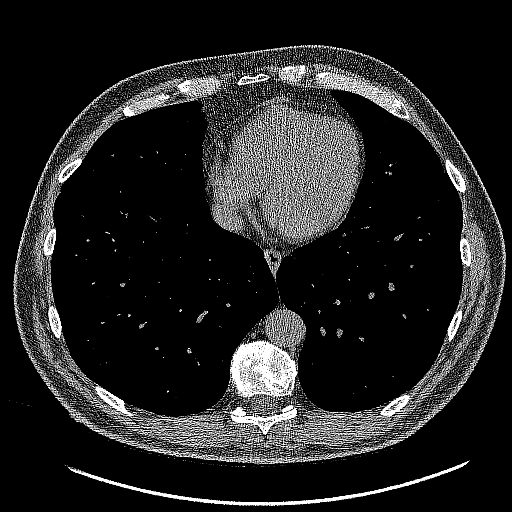
[im 132/346  lung]
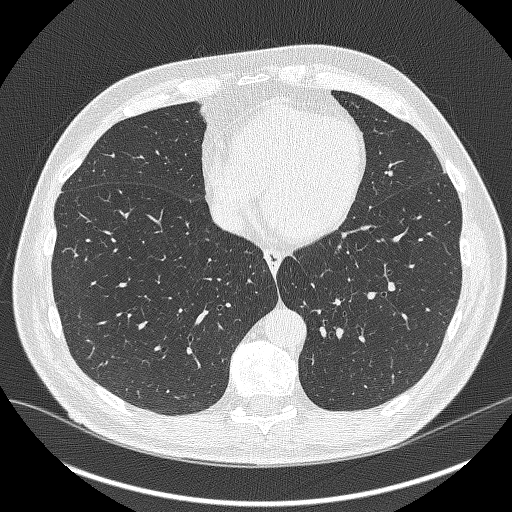
[im 165/346  lung]
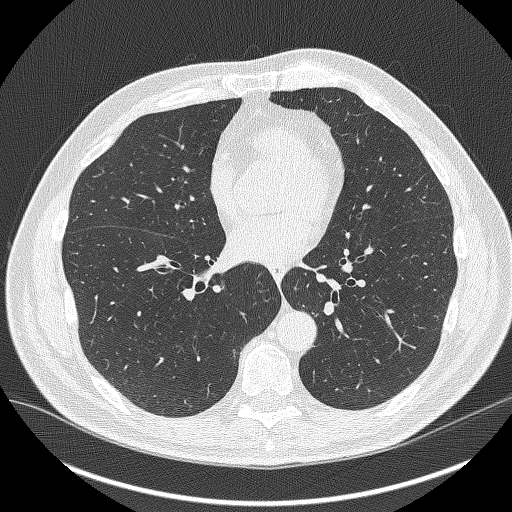
[im 181/346  lung]
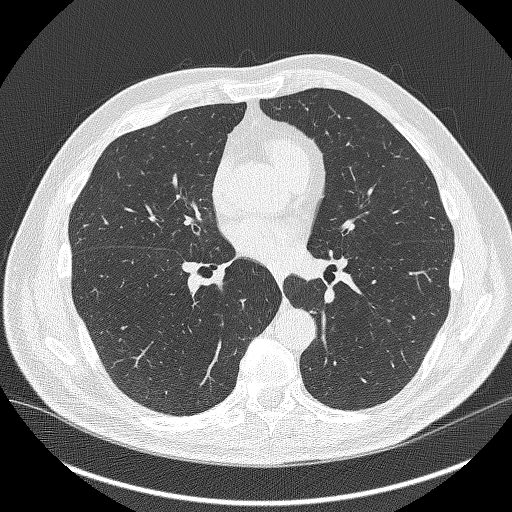
[im 214/346  lung]
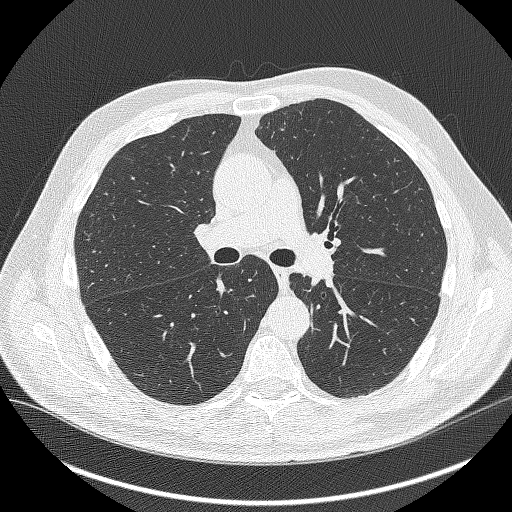
[im 247/346  mediastinal]
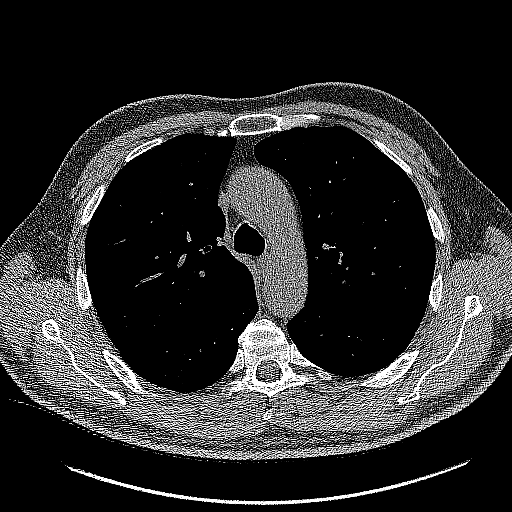
[im 247/346  lung]
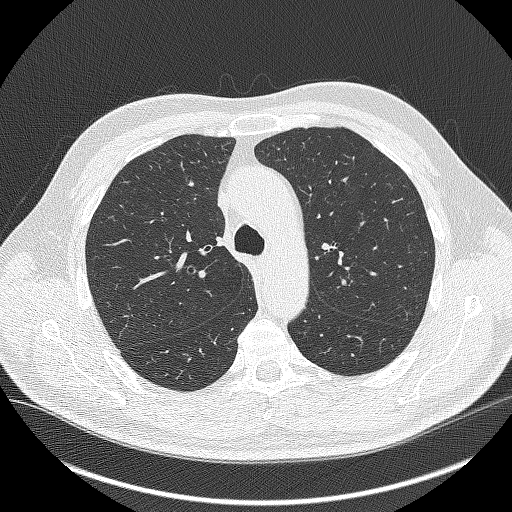
[im 263/346  lung]
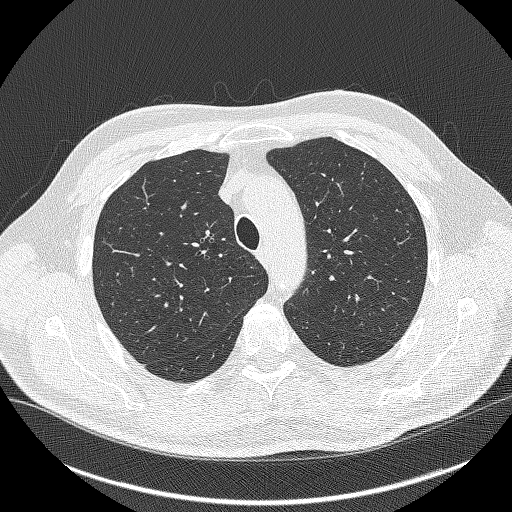
[im 296/346  lung]
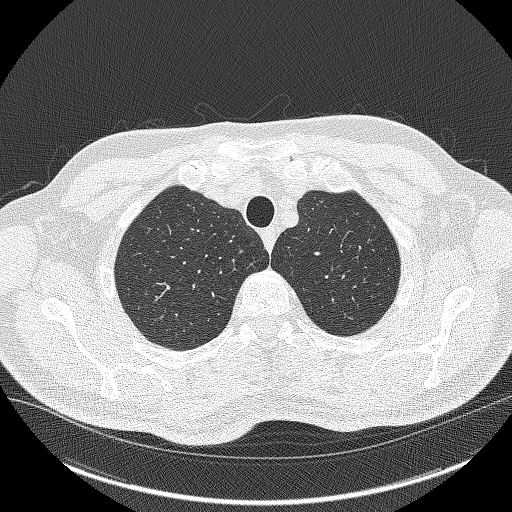
[im 329/346  lung]
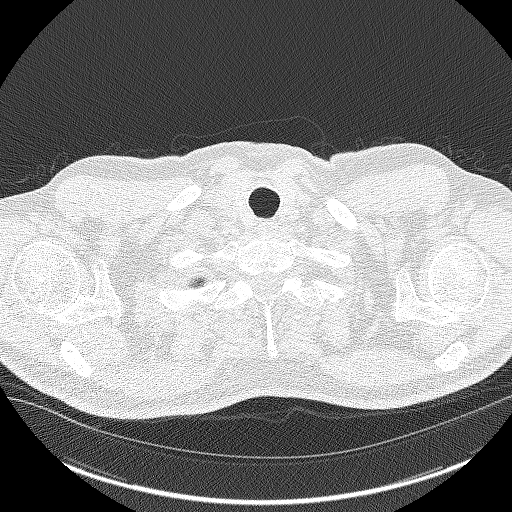

[Series 4: coronals lung 1.00 cor · coronal · 0.67mm/px · 3 of 309 slices shown]
[im 62/309  lung]
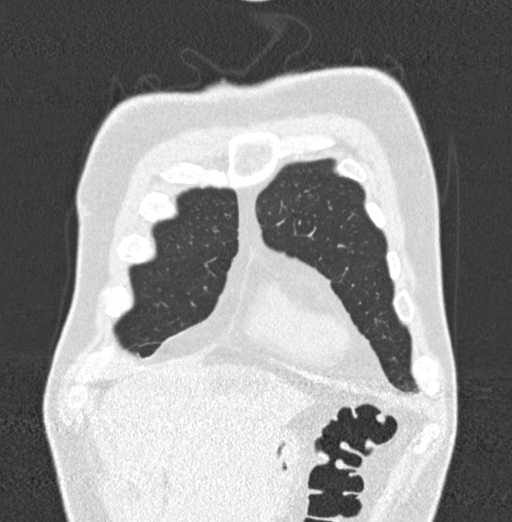
[im 124/309  lung]
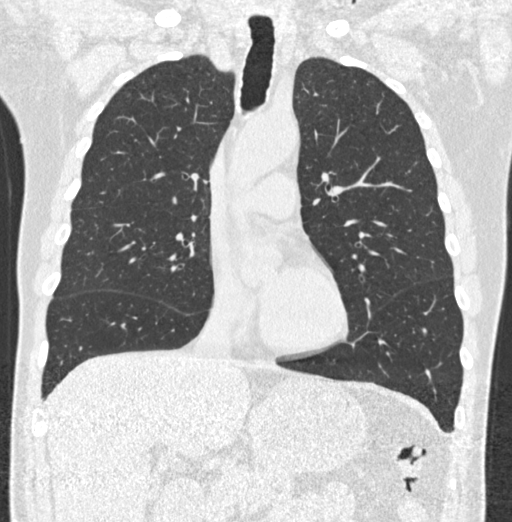
[im 185/309  lung]
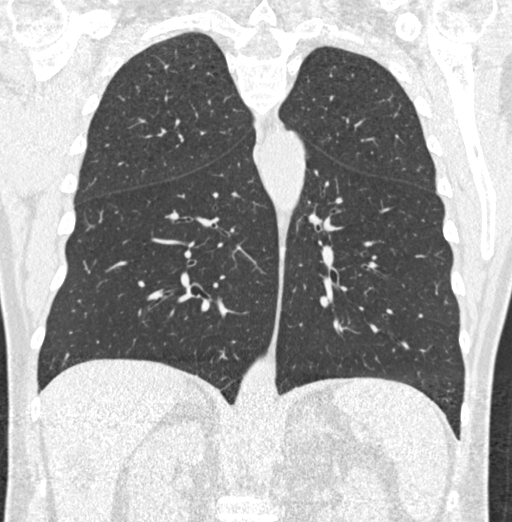

[15 of 40 positions shown; findings below may reference images not displayed]

FINDINGS: Cardiovascular: Heart is normal in size.  No pericardial effusion.

No evidence of thoracic aortic aneurysm. Mild atherosclerotic
calcifications of the aortic arch.

Mild coronary atherosclerosis of the right coronary artery.

Mediastinum/Nodes: No suspicious mediastinal lymphadenopathy.

Visualized thyroid is unremarkable.

Lungs/Pleura: Mild centrilobular emphysematous changes, upper lung
predominant.

No focal consolidation.  Mild lingular scarring/atelectasis.

Scattered small bilateral pulmonary nodules in the right middle and
lower lobe, measuring up to 4.1 mm.

Bronchiectasis bronchial wall thickening involving the right middle
lobe, lingula, and bilateral lower lobes.

No pleural effusion or pneumothorax.

Upper Abdomen: Visualized upper abdomen is notable for scattered
hepatic cysts and mild vascular calcifications.

Musculoskeletal: Mild degenerative changes of the mid thoracic
spine.
IMPRESSION: Lung-RADS 2, benign appearance or behavior. Continue annual
screening with low-dose chest CT without contrast in 12 months.

Aortic Atherosclerosis (AOL8H-ZKF.F) and Emphysema (AOL8H-1L5.U).

## 2021-02-27 ENCOUNTER — Ambulatory Visit: Payer: BC Managed Care – PPO | Admitting: Family Medicine

## 2021-03-04 ENCOUNTER — Ambulatory Visit: Payer: BC Managed Care – PPO | Admitting: Family Medicine

## 2021-03-18 ENCOUNTER — Other Ambulatory Visit: Payer: Self-pay

## 2021-03-18 ENCOUNTER — Ambulatory Visit: Payer: BC Managed Care – PPO | Admitting: Family Medicine

## 2021-03-18 ENCOUNTER — Encounter: Payer: Self-pay | Admitting: Family Medicine

## 2021-03-18 VITALS — BP 120/80 | HR 64 | Ht 71.0 in | Wt 155.0 lb

## 2021-03-18 DIAGNOSIS — F17210 Nicotine dependence, cigarettes, uncomplicated: Secondary | ICD-10-CM | POA: Diagnosis not present

## 2021-03-18 DIAGNOSIS — E785 Hyperlipidemia, unspecified: Secondary | ICD-10-CM | POA: Diagnosis not present

## 2021-03-18 DIAGNOSIS — I700 Atherosclerosis of aorta: Secondary | ICD-10-CM | POA: Diagnosis not present

## 2021-03-18 DIAGNOSIS — I10 Essential (primary) hypertension: Secondary | ICD-10-CM

## 2021-03-18 DIAGNOSIS — Z23 Encounter for immunization: Secondary | ICD-10-CM

## 2021-03-18 MED ORDER — HYDROCHLOROTHIAZIDE 12.5 MG PO TABS: 12.5000 mg | ORAL_TABLET | Freq: Every day | ORAL | 1 refills | Status: DC

## 2021-03-18 MED ORDER — LOSARTAN POTASSIUM 50 MG PO TABS
50.0000 mg | ORAL_TABLET | Freq: Every day | ORAL | 1 refills | Status: DC
Start: 2021-03-18 — End: 2021-09-23

## 2021-03-18 NOTE — Progress Notes (Signed)
Date:  03/18/2021   Name:  Bruce Kelley   DOB:  16-Feb-1956   MRN:  [id number]   Chief Complaint: Hypertension and Flu Vaccine  Hypertension This is a chronic problem. The current episode started more than 1 year ago. The problem has been gradually improving since onset. The problem is controlled. Pertinent negatives include no anxiety, blurred vision, chest pain, headaches, neck pain, orthopnea, palpitations, peripheral edema, PND, shortness of breath or sweats. There are no associated agents to hypertension. Risk factors for coronary artery disease include dyslipidemia. Past treatments include angiotensin blockers and diuretics. The current treatment provides moderate improvement. There are no compliance problems.  There is no history of angina, kidney disease, CAD/MI, CVA, heart failure, left ventricular hypertrophy, PVD or retinopathy. There is no history of chronic renal disease, a hypertension causing med or renovascular disease.   Lab Results  Component Value Date   NA 140 09/10/2020   K 4.4 09/10/2020   CO2 24 09/10/2020   GLUCOSE 101 (H) 09/10/2020   BUN 11 09/10/2020   CREATININE 1.02 09/10/2020   CALCIUM 9.4 09/10/2020   EGFR 82 09/10/2020   GFRNONAA 89 03/12/2020   Lab Results  Component Value Date   CHOL 167 09/10/2020   HDL 37 (L) 09/10/2020   LDLCALC 115 (H) 09/10/2020   TRIG 79 09/10/2020   Lab Results  Component Value Date   TSH 1.630 03/12/2020   No results found for: HGBA1C Lab Results  Component Value Date   WBC 11.9 (H) 09/10/2020   HGB 16.2 09/10/2020   HCT 46.9 09/10/2020   MCV 91 09/10/2020   PLT 282 09/10/2020   Lab Results  Component Value Date   ALT 14 09/10/2020   AST 13 09/10/2020   ALKPHOS 113 09/10/2020   BILITOT 0.3 09/10/2020   No results found for: 25OHVITD2, 25OHVITD3, VD25OH   Review of Systems  Constitutional:  Negative for chills and fever.  HENT:  Negative for drooling, ear discharge, ear pain and sore throat.    Eyes:  Negative for blurred vision.  Respiratory:  Negative for cough, shortness of breath and wheezing.   Cardiovascular:  Negative for chest pain, palpitations, orthopnea, leg swelling and PND.  Gastrointestinal:  Negative for abdominal pain, blood in stool, constipation, diarrhea and nausea.  Endocrine: Negative for polydipsia.  Genitourinary:  Negative for dysuria, frequency, hematuria and urgency.  Musculoskeletal:  Negative for back pain, myalgias and neck pain.  Skin:  Negative for rash.  Allergic/Immunologic: Negative for environmental allergies.  Neurological:  Negative for dizziness and headaches.  Hematological:  Does not bruise/bleed easily.  Psychiatric/Behavioral:  Negative for suicidal ideas. The patient is not nervous/anxious.    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   Torticollis 08/09/2014   Acid reflux 08/09/2014    Allergies  Allergen Reactions   Meloxicam     Hives/ rash    Past Surgical History:  Procedure Laterality Date   APPENDECTOMY     COLONOSCOPY WITH PROPOFOL N/A 09/30/2017   Procedure: COLONOSCOPY WITH PROPOFOL;  Surgeon: Lin Landsman, MD;  Location: [facility];  Service: Endoscopy;  Laterality: N/A;    Social History   Tobacco Use   Smoking status: Every Day    Packs/day: 0.75    Years: 47.00  Pack years: 35.25    Types: Cigarettes   Smokeless tobacco: Never   Tobacco comments:    gave info about ZYban and patches  Substance Use Topics   Alcohol use: Yes    Alcohol/week: 0.0 standard drinks    Comment: occasionally   Drug use: No     Medication list has been reviewed and updated.  Current Meds  Medication Sig   hydrochlorothiazide (HYDRODIURIL) 12.5 MG tablet Take 1 tablet (12.5 mg total) by mouth daily.    losartan (COZAAR) 50 MG tablet Take 1 tablet (50 mg total) by mouth daily.    PHQ 2/9 Scores 03/18/2021 09/10/2020 07/23/2020 03/12/2020  PHQ - 2 Score 0 0 0 0  PHQ- 9 Score 0 0 0 0    GAD 7 : Generalized Anxiety Score 03/18/2021 09/10/2020 07/23/2020 03/12/2020  Nervous, Anxious, on Edge 0 0 0 0  Control/stop worrying 0 0 0 0  Worry too much - different things 0 0 0 0  Trouble relaxing 0 0 0 0  Restless 0 0 0 0  Easily annoyed or irritable 0 0 0 0  Afraid - awful might happen 0 0 0 0  Total GAD 7 Score 0 0 0 0    BP Readings from Last 3 Encounters:  03/18/21 120/80  09/10/20 124/80  07/23/20 130/80    Physical Exam Vitals and nursing note reviewed.  HENT:     Head: Normocephalic.     Right Ear: Tympanic membrane and external ear normal.     Left Ear: Tympanic membrane and external ear normal.     Nose: Nose normal.  Eyes:     General: No scleral icterus.       Right eye: No discharge.        Left eye: No discharge.     Conjunctiva/sclera: Conjunctivae normal.     Pupils: Pupils are equal, round, and reactive to light.  Neck:     Thyroid: No thyromegaly.     Vascular: No JVD.     Trachea: No tracheal deviation.  Cardiovascular:     Rate and Rhythm: Normal rate and regular rhythm.     Heart sounds: Normal heart sounds. No murmur heard.   No friction rub. No gallop.  Pulmonary:     Effort: No respiratory distress.     Breath sounds: Normal breath sounds. No wheezing or rales.  Abdominal:     General: Bowel sounds are normal.     Palpations: Abdomen is soft. There is no hepatomegaly, splenomegaly or mass.     Tenderness: There is no abdominal tenderness. There is no guarding or rebound.  Musculoskeletal:        General: No tenderness. Normal range of motion.     Cervical back: Normal range of motion and neck supple.  Lymphadenopathy:     Cervical: No cervical adenopathy.  Skin:    General: Skin is warm.     Findings: No rash.  Neurological:     Mental Status: He is  alert and oriented to person, place, and time.     Cranial Nerves: No cranial nerve deficit.     Deep Tendon Reflexes: Reflexes are normal and symmetric.    Wt Readings from Last 3 Encounters:  03/18/21 155 lb (70.3 kg)  09/10/20 152 lb (68.9 kg)  07/23/20 151 lb (68.5 kg)    BP 120/80   Pulse 64   Ht '5\' 11"'  (1.803 m)   Wt 155 lb (70.3 kg)   BMI 21.62  kg/m   Assessment and Plan:  1. Essential hypertension Chronic.  Controlled.  Stable.  Uncomplicated.  Blood pressure today is 120/80.  Continue hydrochlorothiazide 12.5 mg once a day and losartan 50 mg once a day.  Will check renal function panel for electrolytes and GFR.  Patient will be rechecked in 6 months. - hydrochlorothiazide (HYDRODIURIL) 12.5 MG tablet; Take 1 tablet (12.5 mg total) by mouth daily.  Dispense: 90 tablet; Refill: 1 - losartan (COZAAR) 50 MG tablet; Take 1 tablet (50 mg total) by mouth daily.  Dispense: 90 tablet; Refill: 1 - Renal Function Panel  2. Dyslipidemia Chronic.  Partially controlled.  Stable.  LDL has been elevated in the 1 15-1 20 range.  Patient had a recent ultrasound which noted hepatic cyst but no steatosis.  Patient has been given low-cholesterol guidelines. - Lipid Panel With LDL/HDL Ratio  3. Cigarette nicotine dependence without complication Patient has been advised of the health risks of smoking and counseled concerning cessation of tobacco products.  4. Aortic atherosclerosis ([facility]) Discussed all means and matters of reducing risk of progression of atherosclerosis including cessation of smoking, controlling cholesterol with diet, and maintaining blood pressure control with current medications. - losartan (COZAAR) 50 MG tablet; Take 1 tablet (50 mg total) by mouth daily.  Dispense: 90 tablet; Refill: 1 - Lipid Panel With LDL/HDL Ratio  5. Need for immunization against influenza Discussed and administered

## 2021-03-18 NOTE — Patient Instructions (Signed)

## 2021-03-19 LAB — RENAL FUNCTION PANEL
Albumin: 4.3 g/dL (ref 3.8–4.8)
BUN/Creatinine Ratio: 7 — ABNORMAL LOW (ref 10–24)
BUN: 7 mg/dL — ABNORMAL LOW (ref 8–27)
CO2: 25 mmol/L (ref 20–29)
Calcium: 9.3 mg/dL (ref 8.6–10.2)
Chloride: 103 mmol/L (ref 96–106)
Creatinine, Ser: 0.95 mg/dL (ref 0.76–1.27)
Glucose: 103 mg/dL — ABNORMAL HIGH (ref 70–99)
Phosphorus: 3.6 mg/dL (ref 2.8–4.1)
Potassium: 4.6 mmol/L (ref 3.5–5.2)
Sodium: 142 mmol/L (ref 134–144)
eGFR: 89 mL/min/{1.73_m2} (ref >59)

## 2021-03-19 LAB — LIPID PANEL WITH LDL/HDL RATIO
Cholesterol, Total: 164 mg/dL (ref 100–199)
HDL: 35 mg/dL — ABNORMAL LOW (ref >39)
LDL Chol Calc (NIH): 108 mg/dL — ABNORMAL HIGH (ref 0–99)
LDL/HDL Ratio: 3.1 ratio (ref 0.0–3.6)
Triglycerides: 114 mg/dL (ref 0–149)
VLDL Cholesterol Cal: 21 mg/dL (ref 5–40)

## 2021-07-04 ENCOUNTER — Telehealth: Payer: Self-pay | Admitting: Acute Care

## 2021-07-04 NOTE — Telephone Encounter (Signed)
Reached pt who stated he was driving and would call program back.   ?

## 2021-08-05 ENCOUNTER — Telehealth: Payer: Self-pay | Admitting: Gastroenterology

## 2021-08-05 ENCOUNTER — Telehealth: Payer: Self-pay

## 2021-08-05 ENCOUNTER — Other Ambulatory Visit: Payer: Self-pay

## 2021-08-05 DIAGNOSIS — Z86010 Personal history of colonic polyps: Secondary | ICD-10-CM

## 2021-08-05 MED ORDER — NA SULFATE-K SULFATE-MG SULF 17.5-3.13-1.6 GM/177ML PO SOLN: 1.0000 | Freq: Once | ORAL | 0 refills | Status: AC

## 2021-08-05 NOTE — Telephone Encounter (Signed)
Gastroenterology Pre-Procedure Review ? ?Request Date: 10/21/21 ?Requesting Physician: Dr. Marius Ditch ? ?PATIENT REVIEW QUESTIONS: The patient responded to the following health history questions as indicated:   ? ?1. Are you having any GI issues? no ?2. Do you have a personal history of Polyps? yes (09/30/17 colonoscopy with Dr. Marius Ditch noted polyps) ?3. Do you have a family history of Colon Cancer or Polyps? no ?4. Diabetes Mellitus? no ?5. Joint replacements in the past 12 months?no ?6. Major health problems in the past 3 months?no ?7. Any artificial heart valves, MVP, or defibrillator?no ?   ?MEDICATIONS & ALLERGIES:    ?Patient reports the following regarding taking any anticoagulation/antiplatelet therapy:   ?Plavix, Coumadin, Eliquis, Xarelto, Lovenox, Pradaxa, Brilinta, or Effient? no ?Aspirin? no ? ?Patient confirms/reports the following medications:  ?Current Outpatient Medications  ?Medication Sig Dispense Refill  ? hydrochlorothiazide (HYDRODIURIL) 12.5 MG tablet Take 1 tablet (12.5 mg total) by mouth daily. 90 tablet 1  ? losartan (COZAAR) 50 MG tablet Take 1 tablet (50 mg total) by mouth daily. 90 tablet 1  ? ?No current facility-administered medications for this visit.  ? ? ?Patient confirms/reports the following allergies:  ?Allergies  ?Allergen Reactions  ? Meloxicam   ?  Hives/ rash  ? ? ?No orders of the defined types were placed in this encounter. ? ? ?AUTHORIZATION INFORMATION ?Primary Insurance: ?1D#: ?Group #: ? ?Secondary Insurance: ?1D#: ?Group #: ? ?SCHEDULE INFORMATION: ?Date: 10/21/21 ?Time: ?Location: [facility] ?

## 2021-08-05 NOTE — Telephone Encounter (Signed)
Patient requesting a call back to schedule colonoscopy.  ?

## 2021-09-03 ENCOUNTER — Telehealth: Payer: Self-pay | Admitting: Gastroenterology

## 2021-09-03 ENCOUNTER — Telehealth: Payer: Self-pay

## 2021-09-03 NOTE — Telephone Encounter (Signed)
CALLED PATIENT NO ANSWER LEFT VOICEMAIL FOR A CALL BACK ? ?

## 2021-09-03 NOTE — Telephone Encounter (Signed)
Patient left a voicemail stating he needs to change his colonoscopy schedule for 10/21/2021

## 2021-09-05 ENCOUNTER — Telehealth: Payer: Self-pay

## 2021-09-05 ENCOUNTER — Other Ambulatory Visit: Payer: Self-pay

## 2021-09-05 DIAGNOSIS — Z1211 Encounter for screening for malignant neoplasm of colon: Secondary | ICD-10-CM

## 2021-09-05 NOTE — Progress Notes (Signed)
Patient called back and wants to keep appointment now so reordered procedure  called endo

## 2021-09-05 NOTE — Telephone Encounter (Signed)
CALLED PATIENT NO ANSWER LEFT VOICEMAIL FOR A CALL BACK Patient needed to reschedule procedure so canceled till I can get in touch with patient called endo

## 2021-09-12 ENCOUNTER — Encounter: Payer: BC Managed Care – PPO | Admitting: Family Medicine

## 2021-09-16 ENCOUNTER — Ambulatory Visit: Payer: BC Managed Care – PPO | Admitting: Family Medicine

## 2021-09-23 ENCOUNTER — Other Ambulatory Visit: Payer: Self-pay | Admitting: Family Medicine

## 2021-09-23 DIAGNOSIS — I700 Atherosclerosis of aorta: Secondary | ICD-10-CM

## 2021-09-23 DIAGNOSIS — I10 Essential (primary) hypertension: Secondary | ICD-10-CM

## 2021-09-30 ENCOUNTER — Encounter: Payer: Self-pay | Admitting: Family Medicine

## 2021-09-30 ENCOUNTER — Ambulatory Visit: Payer: BC Managed Care – PPO | Admitting: Family Medicine

## 2021-09-30 VITALS — BP 122/62 | HR 68 | Ht 71.0 in | Wt 154.0 lb

## 2021-09-30 DIAGNOSIS — I10 Essential (primary) hypertension: Secondary | ICD-10-CM | POA: Diagnosis not present

## 2021-09-30 DIAGNOSIS — I700 Atherosclerosis of aorta: Secondary | ICD-10-CM | POA: Diagnosis not present

## 2021-09-30 MED ORDER — HYDROCHLOROTHIAZIDE 12.5 MG PO TABS: 12.5000 mg | ORAL_TABLET | Freq: Every day | ORAL | 1 refills | Status: DC

## 2021-09-30 MED ORDER — LOSARTAN POTASSIUM 50 MG PO TABS: 50.0000 mg | ORAL_TABLET | Freq: Every day | ORAL | 1 refills | Status: DC

## 2021-10-18 ENCOUNTER — Encounter: Payer: Self-pay | Admitting: Gastroenterology

## 2021-10-21 ENCOUNTER — Encounter
Admission: RE | Disposition: A | Discharge status: Home / Self Care | Payer: Self-pay | Source: Home / Self Care | Attending: Gastroenterology

## 2021-10-21 ENCOUNTER — Ambulatory Visit
Admission: RE | Admit: 2021-10-21 | Discharge: 2021-10-21 | Disposition: A | Discharge status: Home / Self Care | Payer: BC Managed Care – PPO | Attending: Gastroenterology | Admitting: Gastroenterology

## 2021-10-21 ENCOUNTER — Ambulatory Visit: Payer: BC Managed Care – PPO | Admitting: General Practice

## 2021-10-21 ENCOUNTER — Ambulatory Visit: Admit: 2021-10-21 | Payer: BC Managed Care – PPO | Admitting: Gastroenterology

## 2021-10-21 ENCOUNTER — Encounter: Payer: Self-pay | Admitting: Gastroenterology

## 2021-10-21 DIAGNOSIS — Z86010 Personal history of colonic polyps: Secondary | ICD-10-CM | POA: Insufficient documentation

## 2021-10-21 DIAGNOSIS — Z1211 Encounter for screening for malignant neoplasm of colon: Secondary | ICD-10-CM | POA: Diagnosis present

## 2021-10-21 DIAGNOSIS — D123 Benign neoplasm of transverse colon: Secondary | ICD-10-CM | POA: Insufficient documentation

## 2021-10-21 DIAGNOSIS — K635 Polyp of colon: Secondary | ICD-10-CM | POA: Diagnosis not present

## 2021-10-21 DIAGNOSIS — I10 Essential (primary) hypertension: Secondary | ICD-10-CM | POA: Diagnosis not present

## 2021-10-21 DIAGNOSIS — F17210 Nicotine dependence, cigarettes, uncomplicated: Secondary | ICD-10-CM | POA: Insufficient documentation

## 2021-10-21 HISTORY — PX: COLONOSCOPY WITH PROPOFOL: SHX5780

## 2021-10-21 SURGERY — COLONOSCOPY WITH PROPOFOL
Anesthesia: General

## 2021-10-21 MED ORDER — PROPOFOL 10 MG/ML IV BOLUS
INTRAVENOUS | Status: DC | PRN
  Administered 2021-10-21: 80 mg via INTRAVENOUS

## 2021-10-21 MED ORDER — PROPOFOL 500 MG/50ML IV EMUL
INTRAVENOUS | Status: DC | PRN
  Administered 2021-10-21: 150 ug/kg/min via INTRAVENOUS

## 2021-10-21 MED ORDER — LIDOCAINE HCL (CARDIAC) PF 100 MG/5ML IV SOSY
PREFILLED_SYRINGE | INTRAVENOUS | Status: DC | PRN
  Administered 2021-10-21: 50 mg via INTRAVENOUS

## 2021-10-21 MED ORDER — SODIUM CHLORIDE 0.9 % IV SOLN: INTRAVENOUS | Status: DC

## 2021-10-21 NOTE — Anesthesia Procedure Notes (Signed)
Date/Time: 10/21/2021 10:07 AM  Performed by: Johnna Acosta, CRNAPre-anesthesia Checklist: Patient identified, Emergency Drugs available, Suction available, Patient being monitored and Timeout performed Patient Re-evaluated:Patient Re-evaluated prior to induction Oxygen Delivery Method: Nasal cannula Preoxygenation: Pre-oxygenation with 100% oxygen Induction Type: IV induction

## 2021-10-21 NOTE — Transfer of Care (Signed)
Immediate Anesthesia Transfer of Care Note  Patient: Bruce Kelley  Procedure(s) Performed: COLONOSCOPY WITH PROPOFOL  Patient Location: PACU  Anesthesia Type:General  Level of Consciousness: sedated  Airway & Oxygen Therapy: Patient Spontanous Breathing  Post-op Assessment: Report given to RN and Post -op Vital signs reviewed and stable  Post vital signs: Reviewed and stable  Last Vitals:  Vitals Value Taken Time  BP 87/64 10/21/21 1032  Temp 35.9 C 10/21/21 1031  Pulse 77 10/21/21 1032  Resp 15 10/21/21 1032  SpO2 98 % 10/21/21 1032    Last Pain:  Vitals:   10/21/21 1031  TempSrc: Tympanic  PainSc: Asleep         Complications: No notable events documented.

## 2021-10-21 NOTE — Anesthesia Preprocedure Evaluation (Signed)
Anesthesia Evaluation  Patient identified by MRN, date of birth, ID band Patient awake    Reviewed: Allergy & Precautions, NPO status , Patient's Chart, lab work & pertinent test results  Airway Mallampati: III  TM Distance: >3 FB Neck ROM: full    Dental  (+) Poor Dentition, Teeth Intact   Pulmonary neg pulmonary ROS, Current Smoker and Patient abstained from smoking.,    Pulmonary exam normal        Cardiovascular hypertension, negative cardio ROS Normal cardiovascular exam     Neuro/Psych negative neurological ROS  negative psych ROS   GI/Hepatic negative GI ROS, Neg liver ROS,   Endo/Other  negative endocrine ROS  Renal/GU negative Renal ROS  negative genitourinary   Musculoskeletal   Abdominal   Peds  Hematology negative hematology ROS (+)   Anesthesia Other Findings Past Medical History: No date: GERD (gastroesophageal reflux disease) No date: Hypertension     Comment:  controlled  Past Surgical History: No date: APPENDECTOMY 09/30/2017: COLONOSCOPY WITH PROPOFOL; N/A     Comment:  Procedure: COLONOSCOPY WITH PROPOFOL;  Surgeon: Lin Landsman, MD;  Location: [place];                Service: Endoscopy;  Laterality: N/A;     Reproductive/Obstetrics negative OB ROS                            Anesthesia Physical Anesthesia Plan  ASA: 2  Anesthesia Plan: General   Post-op Pain Management: Minimal or no pain anticipated   Induction: Intravenous  PONV Risk Score and Plan: 2 and Propofol infusion, TIVA and Ondansetron  Airway Management Planned: Nasal Cannula  Additional Equipment: None  Intra-op Plan:   Post-operative Plan:   Informed Consent: I have reviewed the patients History and Physical, chart, labs and discussed the procedure including the risks, benefits and alternatives for the proposed anesthesia with the patient or authorized  representative who has indicated his/her understanding and acceptance.     Dental advisory given  Plan Discussed with: CRNA and Surgeon  Anesthesia Plan Comments: (Discussed risks of anesthesia with patient, including possibility of difficulty with spontaneous ventilation under anesthesia necessitating airway intervention, PONV, and rare risks such as cardiac or respiratory or neurological events, and allergic reactions. Discussed the role of CRNA in patient's perioperative care. Patient understands.)        Anesthesia Quick Evaluation

## 2021-10-21 NOTE — Anesthesia Postprocedure Evaluation (Signed)
Anesthesia Post Note  Patient: Bruce Kelley  Procedure(s) Performed: COLONOSCOPY WITH PROPOFOL  Patient location during evaluation: Endoscopy Anesthesia Type: General Level of consciousness: awake and alert Pain management: pain level controlled Vital Signs Assessment: post-procedure vital signs reviewed and stable Respiratory status: spontaneous breathing, nonlabored ventilation, respiratory function stable and patient connected to nasal cannula oxygen Cardiovascular status: blood pressure returned to baseline and stable Postop Assessment: no apparent nausea or vomiting Anesthetic complications: no   No notable events documented.   Last Vitals:  Vitals:   10/21/21 1041 10/21/21 1051  BP: 109/73 127/82  Pulse: 77 65  Resp: 20 16  Temp:    SpO2: 99% 98%    Last Pain:  Vitals:   10/21/21 1051  TempSrc:   PainSc: 0-No pain                 Dimas Millin

## 2021-10-21 NOTE — H&P (Signed)
  Cephas Darby, MD [address]  [place]  [place], [place] [address]  Main: [phone]  Fax: [phone] Pager: [phone]  Primary Care Physician:  Juline Patch, MD Primary Gastroenterologist:  Dr. Cephas Darby  Pre-Procedure History & Physical: HPI:  Bruce Kelley is a 66 y.o. male is here for an colonoscopy.   Past Medical History:  Diagnosis Date   GERD (gastroesophageal reflux disease)    Hypertension    controlled    Past Surgical History:  Procedure Laterality Date   APPENDECTOMY     COLONOSCOPY WITH PROPOFOL N/A 09/30/2017   Procedure: COLONOSCOPY WITH PROPOFOL;  Surgeon: Lin Landsman, MD;  Location: [facility];  Service: Endoscopy;  Laterality: N/A;    Prior to Admission medications   Medication Sig Start Date End Date Taking? Authorizing Provider  hydrochlorothiazide (HYDRODIURIL) 12.5 MG tablet Take 1 tablet (12.5 mg total) by mouth daily. 09/30/21  Yes Juline Patch, MD  losartan (COZAAR) 50 MG tablet Take 1 tablet (50 mg total) by mouth daily. 09/30/21  Yes Juline Patch, MD    Allergies as of 09/05/2021 - Review Complete 08/05/2021  Allergen Reaction Noted   Meloxicam  08/03/2020    History reviewed. No pertinent family history.  Social History   Socioeconomic History   Marital status: Married    Spouse name: Not on file   Number of children: Not on file   Years of education: Not on file   Highest education level: Not on file  Occupational History   Not on file  Tobacco Use   Smoking status: Every Day    Packs/day: 0.75    Years: 47.00    Total pack years: 35.25    Types: Cigarettes   Smokeless tobacco: Never   Tobacco comments:    gave info about ZYban and patches  Vaping Use   Vaping Use: Never used  Substance and Sexual Activity   Alcohol use: Yes    Alcohol/week: 0.0 standard drinks of alcohol    Comment: occasionally   Drug use: No   Sexual activity: Yes  Other Topics Concern   Not on file   Social History Narrative   Not on file   Social Determinants of Health   Financial Resource Strain: Not on file  Food Insecurity: Not on file  Transportation Needs: Not on file  Physical Activity: Not on file  Stress: Not on file  Social Connections: Not on file  Intimate Partner Violence: Not on file    Review of Systems: See HPI, otherwise negative ROS  Physical Exam: BP (!) 149/84   Pulse 88   Temp (!) 96.8 F (36 C) (Temporal)   Resp 16   Ht '5\' 11"'$  (1.803 m)   Wt 72.6 kg   BMI 22.32 kg/m  General:   Alert,  pleasant and cooperative in NAD Head:  Normocephalic and atraumatic. Neck:  Supple; no masses or thyromegaly. Lungs:  Clear throughout to auscultation.    Heart:  Regular rate and rhythm. Abdomen:  Soft, nontender and nondistended. Normal bowel sounds, without guarding, and without rebound.   Neurologic:  Alert and  oriented x4;  grossly normal neurologically.  Impression/Plan: Bruce Kelley is here for an colonoscopy to be performed for h/o colon adenomas  Risks, benefits, limitations, and alternatives regarding  colonoscopy have been reviewed with the patient.  Questions have been answered.  All parties agreeable.   Sherri Sear, MD  10/21/2021, 9:56 AM

## 2021-10-21 NOTE — Op Note (Signed)
[facility] Patient Name: Bruce Kelley Procedure Date: 10/21/2021 10:05 AM MRN: [id number] Account #: [id number] Date of Birth: 01-Apr-1956 Admit Type: Outpatient Age: 66 Room: [facility] ENDO ROOM 4 Gender: Male Note Status: Finalized Instrument Name: Jasper Riling [id number] Procedure:             Colonoscopy Indications:           Surveillance: Personal history of adenomatous polyps                         on last colonoscopy > 3 years ago, Last colonoscopy:                         June 2019 Providers:             Lin Landsman MD, MD Referring MD:          Lin Landsman MD, MD (Referring MD) Medicines:             General Anesthesia Complications:         No immediate complications. Estimated blood loss: None. Procedure:             Pre-Anesthesia Assessment:                        - Prior to the procedure, a History and Physical was                         performed, and patient medications and allergies were                         reviewed. The patient is competent. The risks and                         benefits of the procedure and the sedation options and                         risks were discussed with the patient. All questions                         were answered and informed consent was obtained.                         Patient identification and proposed procedure were                         verified by the physician, the nurse, the                         anesthesiologist, the anesthetist and the technician                         in the pre-procedure area in the procedure room in the                         endoscopy suite. Mental Status Examination: alert and                         oriented. Airway Examination: normal oropharyngeal  airway and neck mobility. Respiratory Examination:                         clear to auscultation. CV Examination: normal.                         Prophylactic Antibiotics:  The patient does not require                         prophylactic antibiotics. Prior Anticoagulants: The                         patient has taken no previous anticoagulant or                         antiplatelet agents. ASA Grade Assessment: II - A                         patient with mild systemic disease. After reviewing                         the risks and benefits, the patient was deemed in                         satisfactory condition to undergo the procedure. The                         anesthesia plan was to use general anesthesia.                         Immediately prior to administration of medications,                         the patient was re-assessed for adequacy to receive                         sedatives. The heart rate, respiratory rate, oxygen                         saturations, blood pressure, adequacy of pulmonary                         ventilation, and response to care were monitored                         throughout the procedure. The physical status of the                         patient was re-assessed after the procedure.                        After obtaining informed consent, the colonoscope was                         passed under direct vision. Throughout the procedure,                         the patient's blood pressure, pulse, and oxygen  saturations were monitored continuously. The                         Colonoscope was introduced through the anus and                         advanced to the the cecum, identified by appendiceal                         orifice and ileocecal valve. The colonoscopy was                         performed without difficulty. The patient tolerated                         the procedure well. The quality of the bowel                         preparation was evaluated using the BBPS [facility] Bowel                         Preparation Scale) with scores of: Right Colon = 3,                         Transverse  Colon = 3 and Left Colon = 3 (entire mucosa                         seen well with no residual staining, small fragments                         of stool or opaque liquid). The total BBPS score                         equals 9. Findings:      The perianal and digital rectal examinations were normal. Pertinent       negatives include normal sphincter tone and no palpable rectal lesions.      Two sessile polyps were found in the transverse colon. The polyps were 4       to 6 mm in size. These polyps were removed with a cold snare. Resection       and retrieval were complete.      The retroflexed view of the distal rectum and anal verge was normal and       showed no anal or rectal abnormalities. Impression:            - Two 4 to 6 mm polyps in the transverse colon,                         removed with a cold snare. Resected and retrieved.                        - The distal rectum and anal verge are normal on                         retroflexion view. Recommendation:        - Discharge patient to home (with escort).                        -  Resume previous diet today.                        - Continue present medications.                        - Await pathology results.                        - Repeat colonoscopy in 5 years for surveillance based                         on pathology results. Procedure Code(s):     --- Professional ---                        [phone], Colonoscopy, flexible; with removal of                         tumor(s), polyp(s), or other lesion(s) by snare                         technique Diagnosis Code(s):     --- Professional ---                        Z86.010, Personal history of colonic polyps                        K63.5, Polyp of colon CPT copyright 2019 [employer]. All rights reserved. The codes documented in this report are preliminary and upon coder review may  be revised to meet current compliance requirements. Dr. Ulyess Mort Lin Landsman MD, MD 10/21/2021 10:30:43 AM This report has been signed electronically. Number of Addenda: 0 Note Initiated On: 10/21/2021 10:05 AM Scope Withdrawal Time: 0 hours 10 minutes 59 seconds  Total Procedure Duration: 0 hours 13 minutes 54 seconds  Estimated Blood Loss:  Estimated blood loss: none.      [facility]

## 2021-10-22 ENCOUNTER — Encounter: Payer: Self-pay | Admitting: Gastroenterology

## 2021-10-22 LAB — SURGICAL PATHOLOGY

## 2021-10-24 ENCOUNTER — Encounter: Payer: BC Managed Care – PPO | Admitting: Family Medicine

## 2021-10-24 ENCOUNTER — Ambulatory Visit (INDEPENDENT_AMBULATORY_CARE_PROVIDER_SITE_OTHER): Payer: BC Managed Care – PPO | Admitting: Family Medicine

## 2021-10-24 ENCOUNTER — Encounter: Payer: Self-pay | Admitting: Family Medicine

## 2021-10-24 VITALS — BP 138/89 | HR 68 | Ht 71.0 in | Wt 155.0 lb

## 2021-10-24 DIAGNOSIS — Z0000 Encounter for general adult medical examination without abnormal findings: Secondary | ICD-10-CM | POA: Diagnosis not present

## 2021-10-24 DIAGNOSIS — D229 Melanocytic nevi, unspecified: Secondary | ICD-10-CM

## 2021-10-24 NOTE — Progress Notes (Signed)
Date:  10/24/2021   Name:  Bruce Kelley   DOB:  07-30-1955   MRN:  [id number]   Chief Complaint: Annual Exam  Patient is a 65 year old male who presents for a comprehensive physical exam. The patient reports the following problems: none. Health maintenance has been reviewed up to date.Bruce Kelley is a 66 y.o. male who presents today for his Complete Annual Exam. He feels well. He reports exercising consideration. He reports he is sleeping fairly well.        Lab Results  Component Value Date   NA 142 03/18/2021   K 4.6 03/18/2021   CO2 25 03/18/2021   GLUCOSE 103 (H) 03/18/2021   BUN 7 (L) 03/18/2021   CREATININE 0.95 03/18/2021   CALCIUM 9.3 03/18/2021   EGFR 89 03/18/2021   GFRNONAA 89 03/12/2020   Lab Results  Component Value Date   CHOL 164 03/18/2021   HDL 35 (L) 03/18/2021   LDLCALC 108 (H) 03/18/2021   TRIG 114 03/18/2021   Lab Results  Component Value Date   TSH 1.630 03/12/2020   No results found for: "HGBA1C" Lab Results  Component Value Date   WBC 11.9 (H) 09/10/2020   HGB 16.2 09/10/2020   HCT 46.9 09/10/2020   MCV 91 09/10/2020   PLT 282 09/10/2020   Lab Results  Component Value Date   ALT 14 09/10/2020   AST 13 09/10/2020   ALKPHOS 113 09/10/2020   BILITOT 0.3 09/10/2020   No results found for: "25OHVITD2", "25OHVITD3", "VD25OH"   Review of Systems  Constitutional:  Negative for chills and fever.  HENT: Negative.  Negative for sore throat.   Eyes: Negative.   Respiratory:  Negative for cough, chest tightness, shortness of breath and wheezing.   Cardiovascular:  Negative for chest pain, palpitations and leg swelling.  Gastrointestinal:  Negative for abdominal pain, blood in stool, constipation, diarrhea and nausea.  Endocrine: Negative for cold intolerance, heat intolerance, polydipsia and polyuria.  Genitourinary:  Negative for difficulty urinating, dysuria, frequency, hematuria and urgency.       Nocturia  Musculoskeletal:   Negative for back pain, myalgias and neck pain.  Skin:  Negative for rash.  Allergic/Immunologic: Negative for environmental allergies.  Neurological:  Negative for dizziness and headaches.  Hematological:  Does not bruise/bleed easily.  Psychiatric/Behavioral:  Negative for suicidal ideas. The patient is not nervous/anxious.     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   Torticollis 08/09/2014   Acid reflux 08/09/2014    Allergies  Allergen Reactions   Meloxicam     Hives/ rash   Aspirin Rash    Past Surgical History:  Procedure Laterality Date   APPENDECTOMY     COLONOSCOPY WITH PROPOFOL N/A 09/30/2017   Procedure: COLONOSCOPY WITH PROPOFOL;  Surgeon: Lin Landsman, MD;  Location: [place];  Service: Endoscopy;  Laterality: N/A;   COLONOSCOPY WITH PROPOFOL N/A 10/21/2021   Procedure: COLONOSCOPY WITH PROPOFOL;  Surgeon: Lin Landsman, MD;  Location: [facility] ENDOSCOPY;  Service: Gastroenterology;  Laterality: N/A;    Social History   Tobacco Use   Smoking status: Every Day    Packs/day: 0.75    Years: 47.00    Total pack years: 35.25  Types: Cigarettes   Smokeless tobacco: Never   Tobacco comments:    gave info about ZYban and patches  Vaping Use   Vaping Use: Never used  Substance Use Topics   Alcohol use: Yes    Alcohol/week: 0.0 standard drinks of alcohol    Comment: occasionally   Drug use: No     Medication list has been reviewed and updated.  No outpatient medications have been marked as taking for the 10/24/21 encounter (Office Visit) with Juline Patch, MD.       10/24/2021    9:02 AM 09/30/2021   10:07 AM 03/18/2021    8:03 AM 09/10/2020    8:49 AM  GAD 7 : Generalized Anxiety Score  Nervous, Anxious, on  Edge 0 0 0 0  Control/stop worrying 0 0 0 0  Worry too much - different things 0 0 0 0  Trouble relaxing 0 0 0 0  Restless 0 0 0 0  Easily annoyed or irritable 0 0 0 0  Afraid - awful might happen 0 0 0 0  Total GAD 7 Score 0 0 0 0  Anxiety Difficulty Not difficult at all Not difficult at all         10/24/2021    9:02 AM 09/30/2021   10:06 AM 03/18/2021    8:03 AM  Depression screen PHQ 2/9  Decreased Interest 0 0 0  Down, Depressed, Hopeless 0 0 0  PHQ - 2 Score 0 0 0  Altered sleeping 0 0 0  Tired, decreased energy 0 1 0  Change in appetite 0 0 0  Feeling bad or failure about yourself  0 0 0  Trouble concentrating 0 0 0  Moving slowly or fidgety/restless 0 0 0  Suicidal thoughts 0 0 0  PHQ-9 Score 0 1 0  Difficult doing work/chores Not difficult at all Not difficult at all     BP Readings from Last 3 Encounters:  10/24/21 (!) 160/100  10/21/21 127/82  09/30/21 122/62    Physical Exam Vitals and nursing note reviewed.  Constitutional:      Appearance: He is well-groomed.  HENT:     Head: Normocephalic.     Right Ear: Hearing, tympanic membrane, ear canal and external ear normal.     Left Ear: Hearing, tympanic membrane, ear canal and external ear normal.     Nose: Nose normal. No congestion or rhinorrhea.     Mouth/Throat:     Lips: Pink.     Mouth: Mucous membranes are moist.     Dentition: Normal dentition. No dental tenderness.     Tongue: No lesions.     Pharynx: Oropharynx is clear. Uvula midline. No pharyngeal swelling, oropharyngeal exudate, posterior oropharyngeal erythema or uvula swelling.  Eyes:     General: Lids are normal. Vision grossly intact. Gaze aligned appropriately. No scleral icterus.       Right eye: No discharge.        Left eye: No discharge.     Extraocular Movements: Extraocular movements intact.     Conjunctiva/sclera: Conjunctivae normal.     Pupils: Pupils are equal, round, and reactive to light.     Funduscopic exam:    Right  eye: Red reflex present.        Left eye: Red reflex present. Neck:     Thyroid: No thyroid mass, thyromegaly or thyroid tenderness.     Vascular: Normal carotid pulses. No carotid bruit, hepatojugular reflux or JVD.  Trachea: Trachea and phonation normal. No tracheal deviation.  Cardiovascular:     Rate and Rhythm: Normal rate and regular rhythm.     Chest Wall: PMI is not displaced.     Pulses: Normal pulses.     Heart sounds: Normal heart sounds, S1 normal and S2 normal. No murmur heard.    No systolic murmur is present.     No diastolic murmur is present.     No friction rub. No gallop. No S3 or S4 sounds.  Pulmonary:     Effort: No respiratory distress.     Breath sounds: Normal breath sounds. No decreased breath sounds, wheezing, rhonchi or rales.  Chest:  Breasts:    Breasts are symmetrical.     Right: Normal. No mass.     Left: Normal. No mass.  Abdominal:     General: Abdomen is flat. Bowel sounds are normal.     Palpations: Abdomen is soft. There is no hepatomegaly, splenomegaly or mass.     Tenderness: There is no abdominal tenderness. There is no guarding or rebound.     Hernia: There is no hernia in the left inguinal area or right inguinal area.  Genitourinary:    Penis: Normal.      Testes: Normal.        Right: Mass not present.        Left: Mass not present.     Epididymis:     Right: Normal.  Musculoskeletal:        General: No tenderness. Normal range of motion.     Cervical back: Normal, full passive range of motion without pain, normal range of motion and neck supple.     Thoracic back: Deformity present.     Lumbar back: Normal.     Right lower leg: No edema.     Left lower leg: No edema.     Comments: kyphosis  Lymphadenopathy:     Head:     Right side of head: No submandibular adenopathy.     Left side of head: No submandibular adenopathy.     Cervical: No cervical adenopathy.     Right cervical: No superficial, deep or posterior cervical  adenopathy.    Left cervical: No superficial, deep or posterior cervical adenopathy.     Upper Body:     Right upper body: No supraclavicular, axillary or pectoral adenopathy.     Left upper body: No supraclavicular, axillary or pectoral adenopathy.     Lower Body: No right inguinal adenopathy. No left inguinal adenopathy.  Skin:    General: Skin is warm.     Findings: No rash.  Neurological:     General: No focal deficit present.     Mental Status: He is alert and oriented to person, place, and time.     Cranial Nerves: Cranial nerves 2-12 are intact. No cranial nerve deficit.     Sensory: Sensation is intact.     Motor: Motor function is intact.     Coordination: Romberg sign negative.     Deep Tendon Reflexes: Reflexes are normal and symmetric.  Psychiatric:        Behavior: Behavior is cooperative.     Wt Readings from Last 3 Encounters:  10/24/21 155 lb (70.3 kg)  10/21/21 160 lb (72.6 kg)  09/30/21 154 lb (69.9 kg)    BP (!) 160/100   Pulse 68   Ht '5\' 11"'  (1.803 m)   Wt 155 lb (70.3 kg)   BMI 21.62 kg/m  Assessment and Plan: 1. Annual physical exam Immunizations are reviewed and recommendations provided.   Age appropriate screening tests are discussed. Counseling given for risk factor reduction interventions.  No subjective/objective concerns noted during history of present illness, review of systems, and physical exam.  We will check a CBC renal function panel lipid panel and PSA. - CBC with Differential/Platelet - Renal Function Panel - Lipid Panel With LDL/HDL Ratio - PSA  2. Change in mole Patient had several nevi on his body that he wished to have checked by dermatology and referral was made. - Ambulatory referral to Dermatology

## 2021-10-25 ENCOUNTER — Telehealth: Payer: Self-pay | Admitting: Family Medicine

## 2021-10-25 LAB — LIPID PANEL WITH LDL/HDL RATIO
Cholesterol, Total: 151 mg/dL (ref 100–199)
HDL: 35 mg/dL — ABNORMAL LOW (ref >39)
LDL Chol Calc (NIH): 98 mg/dL (ref 0–99)
LDL/HDL Ratio: 2.8 ratio (ref 0.0–3.6)
Triglycerides: 95 mg/dL (ref 0–149)
VLDL Cholesterol Cal: 18 mg/dL (ref 5–40)

## 2021-10-25 LAB — RENAL FUNCTION PANEL
Albumin: 4.2 g/dL (ref 3.9–4.9)
BUN/Creatinine Ratio: 10 (ref 10–24)
BUN: 8 mg/dL (ref 8–27)
CO2: 23 mmol/L (ref 20–29)
Calcium: 8.8 mg/dL (ref 8.6–10.2)
Chloride: 105 mmol/L (ref 96–106)
Creatinine, Ser: 0.83 mg/dL (ref 0.76–1.27)
Glucose: 89 mg/dL (ref 70–99)
Phosphorus: 3.2 mg/dL (ref 2.8–4.1)
Potassium: 4 mmol/L (ref 3.5–5.2)
Sodium: 141 mmol/L (ref 134–144)
eGFR: 97 mL/min/{1.73_m2} (ref >59)

## 2021-10-25 LAB — PSA: Prostate Specific Ag, Serum: 1.2 ng/mL (ref 0.0–4.0)

## 2021-10-25 NOTE — Telephone Encounter (Signed)
Patient does not understand why he is scheduled with PCP on 10/28/2021 for a medication refill. Patient stated he had a physical appointment on  10/24/2021 and states he has 1 refill left on his medication and would like to speak PCP nurse for clarity.

## 2021-10-28 ENCOUNTER — Ambulatory Visit: Payer: BC Managed Care – PPO | Admitting: Family Medicine

## 2022-01-15 NOTE — Telephone Encounter (Signed)
ERROR

## 2022-03-17 ENCOUNTER — Ambulatory Visit: Payer: BC Managed Care – PPO | Admitting: Family Medicine

## 2022-03-28 ENCOUNTER — Ambulatory Visit: Payer: BC Managed Care – PPO | Admitting: Family Medicine

## 2022-04-08 IMAGING — CT CT CHEST LUNG CANCER SCREENING LOW DOSE W/O CM
2 of 5 series · 15 of 40 positions shown, 18 images · non-contrast
Comparison: 02/22/2019

CLINICAL DATA: Lung cancer screening. 35.7 pack-year history.
Current asymptomatic smoker.

EXAM:
CT CHEST WITHOUT CONTRAST LOW-DOSE FOR LUNG CANCER SCREENING
TECHNIQUE: Multidetector CT imaging of the chest was performed following the
standard protocol without IV contrast.

[Series 3: lung 1.00 · axial · 0.78mm/px · z∈[-1224,-914]mm · 12 of 342 slices shown, 15 images]
[im 16/342  mediastinal]
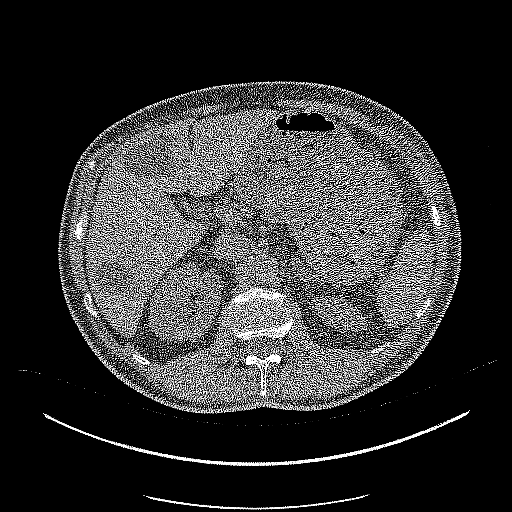
[im 16/342  lung]
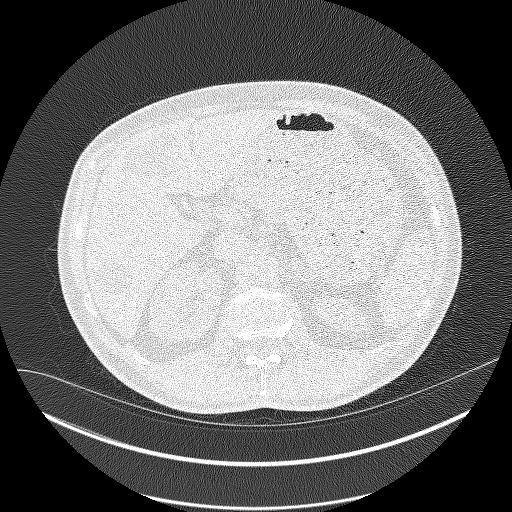
[im 47/342  lung]
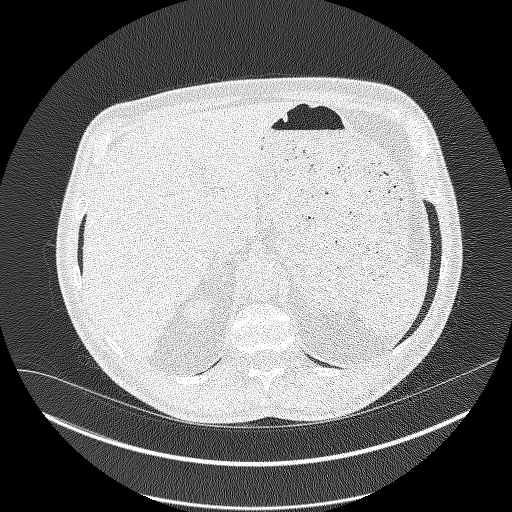
[im 78/342  lung]
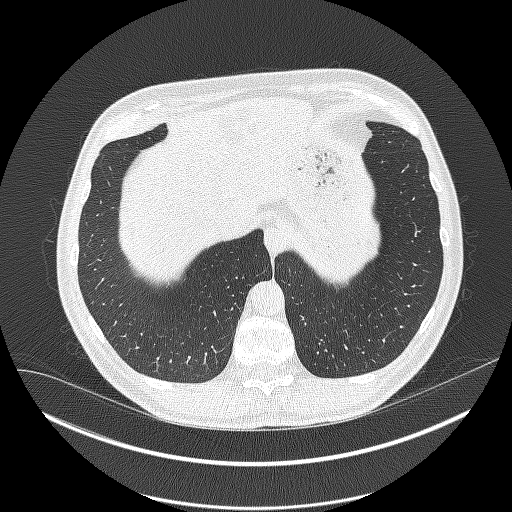
[im 109/342  lung]
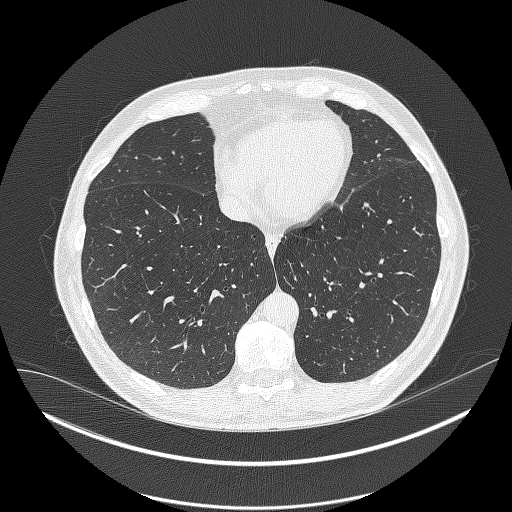
[im 125/342  mediastinal]
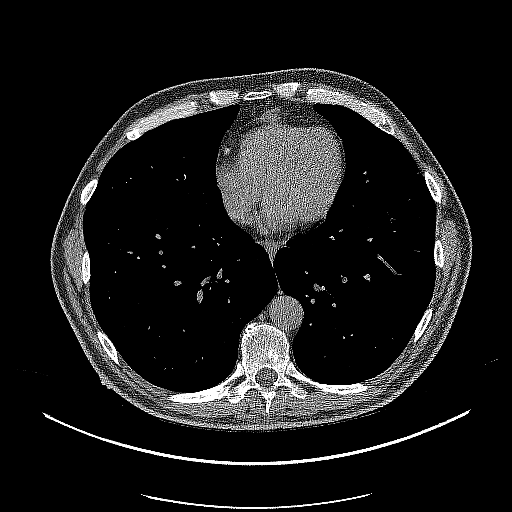
[im 125/342  lung]
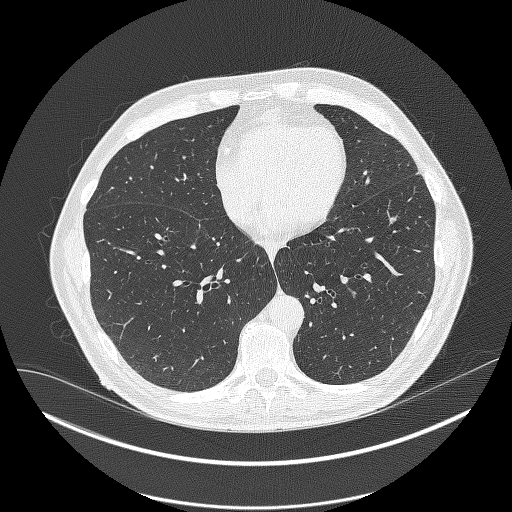
[im 156/342  lung]
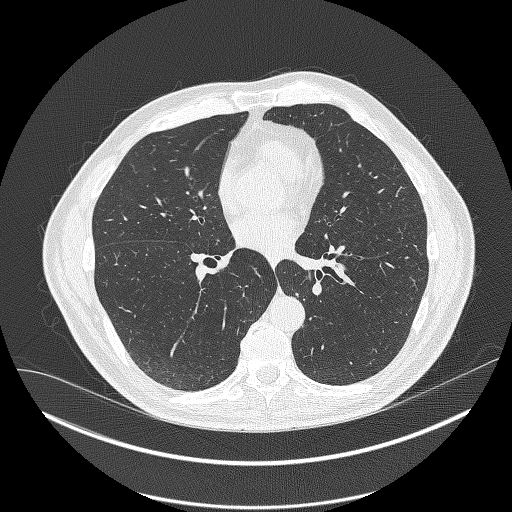
[im 187/342  lung]
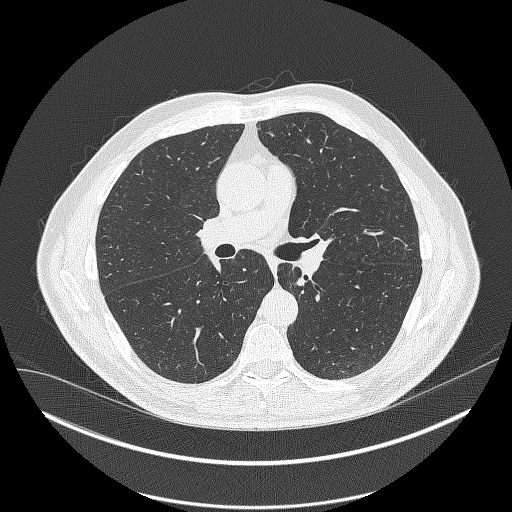
[im 218/342  lung]
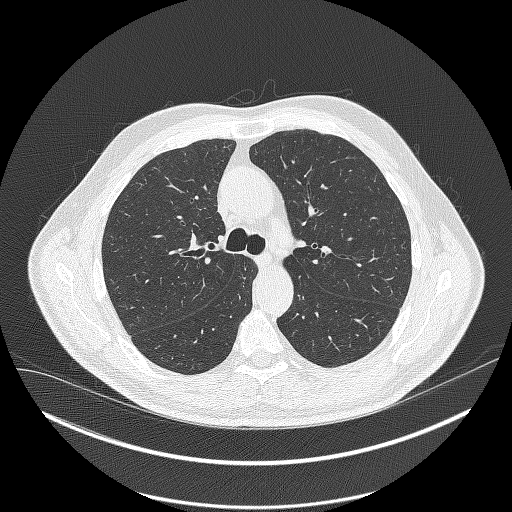
[im 233/342  mediastinal]
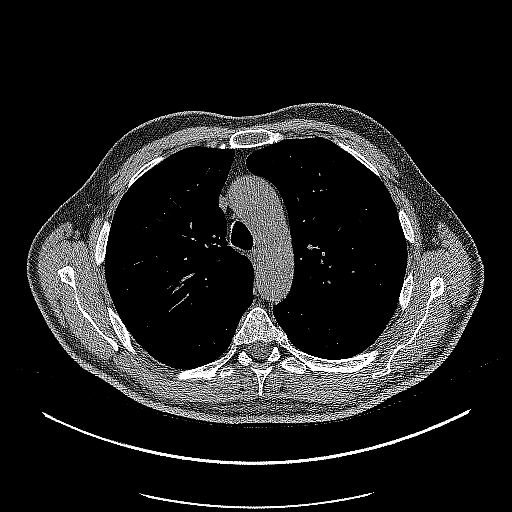
[im 233/342  lung]
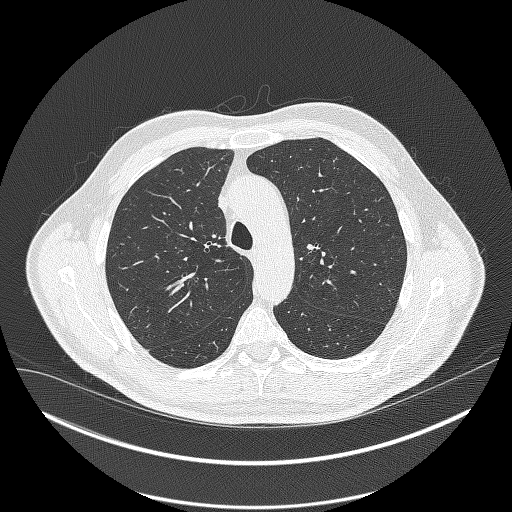
[im 264/342  lung]
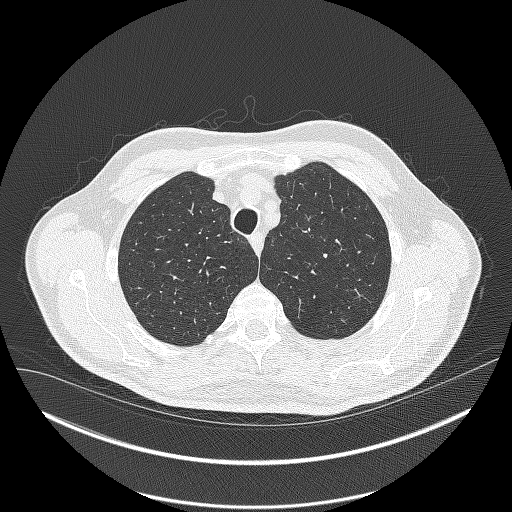
[im 295/342  lung]
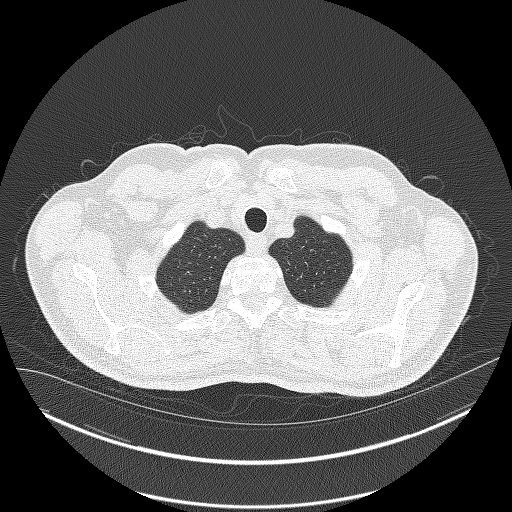
[im 326/342  lung]
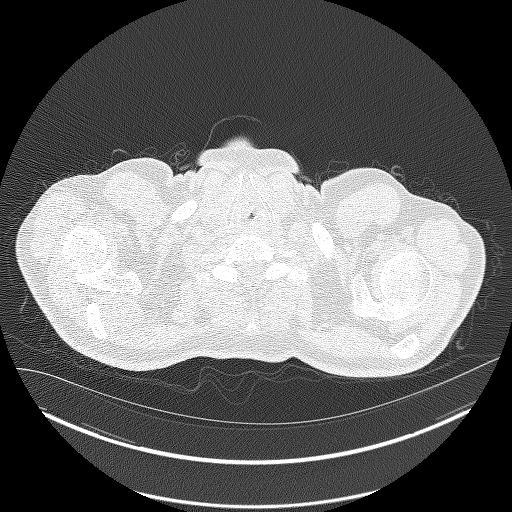

[Series 4: coronals lung 1.00 cor · coronal · 0.67mm/px · 3 of 330 slices shown]
[im 66/330  lung]
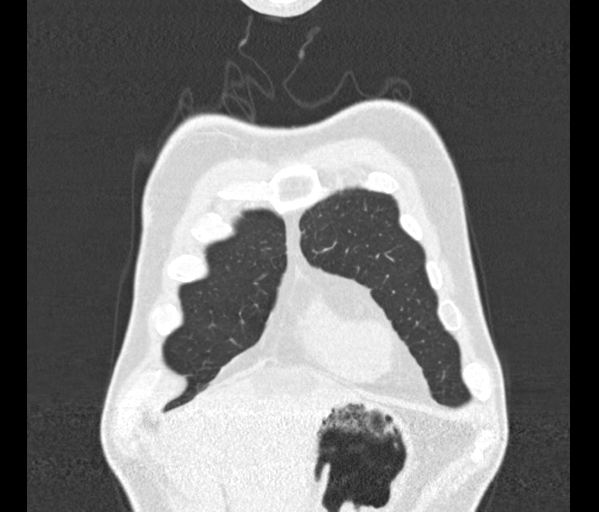
[im 132/330  lung]
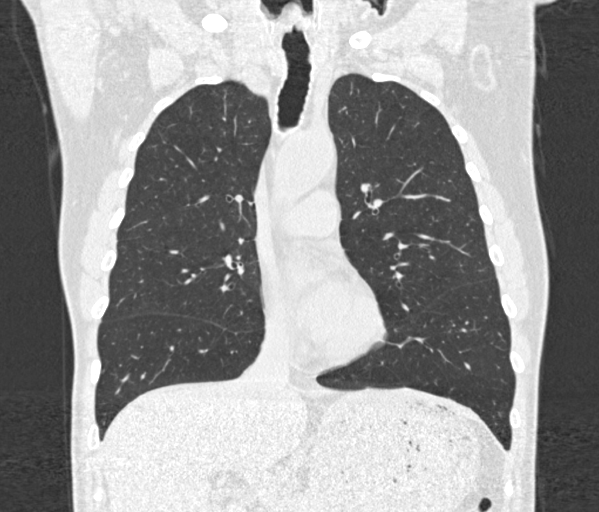
[im 198/330  lung]
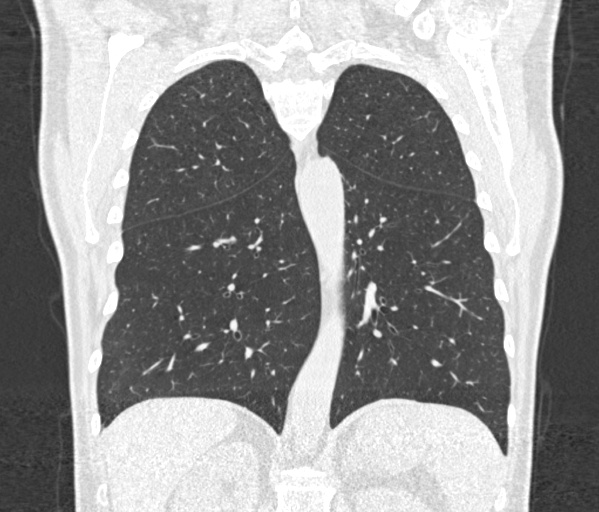

[15 of 40 positions shown; findings below may reference images not displayed]

FINDINGS: Cardiovascular: Normal heart size. No pericardial effusion. Mild
aortic atherosclerosis. Mild coronary artery calcifications.

Mediastinum/Nodes: No enlarged mediastinal, hilar, or axillary lymph
nodes. Thyroid gland, trachea, and esophagus demonstrate no
significant findings.

Lungs/Pleura: No pleural effusion. Moderate centrilobular emphysema.
No airspace consolidation, atelectasis, or pneumothorax. Small
scattered lung nodules are again noted. The largest is in the
lateral right lung base with an equivalent diameter of 3.2 mm.

Upper Abdomen: No acute findings. Several large liver cysts are
again identified. The largest is in segment 4B measuring 5.1 cm.

Musculoskeletal: No chest wall mass or suspicious bone lesions
identified.
IMPRESSION: 1. Lung-RADS 2, benign appearance or behavior. Continue annual
screening with low-dose chest CT without contrast in 12 months.
2. Mild coronary artery calcifications.

Aortic Atherosclerosis (1XI6K-U23.3) and Emphysema (1XI6K-EBJ.A).

## 2022-04-14 ENCOUNTER — Encounter: Payer: Self-pay | Admitting: Family Medicine

## 2022-04-14 ENCOUNTER — Ambulatory Visit: Payer: 59 | Admitting: Family Medicine

## 2022-04-14 ENCOUNTER — Other Ambulatory Visit: Payer: Self-pay | Admitting: *Deleted

## 2022-04-14 VITALS — BP 118/60 | HR 70 | Ht 71.0 in | Wt 155.0 lb

## 2022-04-14 DIAGNOSIS — Z87891 Personal history of nicotine dependence: Secondary | ICD-10-CM

## 2022-04-14 DIAGNOSIS — I10 Essential (primary) hypertension: Secondary | ICD-10-CM | POA: Diagnosis not present

## 2022-04-14 DIAGNOSIS — Z23 Encounter for immunization: Secondary | ICD-10-CM | POA: Diagnosis not present

## 2022-04-14 DIAGNOSIS — I700 Atherosclerosis of aorta: Secondary | ICD-10-CM

## 2022-04-14 DIAGNOSIS — F17210 Nicotine dependence, cigarettes, uncomplicated: Secondary | ICD-10-CM

## 2022-04-14 DIAGNOSIS — Z122 Encounter for screening for malignant neoplasm of respiratory organs: Secondary | ICD-10-CM

## 2022-04-14 MED ORDER — HYDROCHLOROTHIAZIDE 12.5 MG PO TABS: 12.5000 mg | ORAL_TABLET | Freq: Every day | ORAL | 1 refills | Status: DC

## 2022-04-14 MED ORDER — LOSARTAN POTASSIUM 50 MG PO TABS: 50.0000 mg | ORAL_TABLET | Freq: Every day | ORAL | 1 refills | Status: DC

## 2022-04-14 NOTE — Progress Notes (Signed)
Date:  04/14/2022   Name:  Bruce Kelley   DOB:  [birth year]   MRN:  [id number]   Chief Complaint: Flu Vaccine and Hypertension  Hypertension This is a chronic problem. The current episode started more than 1 year ago. The problem has been gradually improving since onset. The problem is controlled. Pertinent negatives include no blurred vision, chest pain, headaches, neck pain, orthopnea, palpitations, PND or shortness of breath. There are no associated agents to hypertension. Risk factors for coronary artery disease include dyslipidemia. Past treatments include diuretics and angiotensin blockers. The current treatment provides moderate improvement. There are no compliance problems.  There is no history of CAD/MI.    Lab Results  Component Value Date   NA 141 10/24/2021   K 4.0 10/24/2021   CO2 23 10/24/2021   GLUCOSE 89 10/24/2021   BUN 8 10/24/2021   CREATININE 0.83 10/24/2021   CALCIUM 8.8 10/24/2021   EGFR 97 10/24/2021   GFRNONAA 89 03/12/2020   Lab Results  Component Value Date   CHOL 151 10/24/2021   HDL 35 (L) 10/24/2021   LDLCALC 98 10/24/2021   TRIG 95 10/24/2021   Lab Results  Component Value Date   TSH 1.630 03/12/2020   No results found for: "HGBA1C" Lab Results  Component Value Date   WBC 11.9 (H) 09/10/2020   HGB 16.2 09/10/2020   HCT 46.9 09/10/2020   MCV 91 09/10/2020   PLT 282 09/10/2020   Lab Results  Component Value Date   ALT 14 09/10/2020   AST 13 09/10/2020   ALKPHOS 113 09/10/2020   BILITOT 0.3 09/10/2020   No results found for: "25OHVITD2", "25OHVITD3", "VD25OH"   Review of Systems  Constitutional:  Negative for chills and fever.  HENT:  Negative for drooling, ear discharge, ear pain and sore throat.   Eyes:  Negative for blurred vision.  Respiratory:  Negative for cough, shortness of breath and wheezing.   Cardiovascular:  Negative for chest pain, palpitations, orthopnea, leg swelling and PND.  Gastrointestinal:  Negative for  abdominal pain, blood in stool, constipation, diarrhea and nausea.  Endocrine: Negative for polydipsia.  Genitourinary:  Negative for dysuria, frequency, hematuria and urgency.  Musculoskeletal:  Negative for back pain, myalgias and neck pain.  Skin:  Negative for rash.  Allergic/Immunologic: Negative for environmental allergies.  Neurological:  Negative for dizziness and headaches.  Hematological:  Does not bruise/bleed easily.  Psychiatric/Behavioral:  Negative for suicidal ideas. The patient is not nervous/anxious.     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   Torticollis 08/09/2014   Acid reflux 08/09/2014    Allergies  Allergen Reactions   Meloxicam     Hives/ rash   Aspirin Rash    Past Surgical History:  Procedure Laterality Date   APPENDECTOMY     COLONOSCOPY WITH PROPOFOL N/A 09/30/2017   Procedure: COLONOSCOPY WITH PROPOFOL;  Surgeon: Lin Landsman, MD;  Location: [place];  Service: Endoscopy;  Laterality: N/A;   COLONOSCOPY WITH PROPOFOL N/A 10/21/2021   Procedure: COLONOSCOPY WITH PROPOFOL;  Surgeon: Lin Landsman, MD;  Location: [facility] ENDOSCOPY;  Service: Gastroenterology;  Laterality: N/A;    Social History   Tobacco Use   Smoking status: Every Day    Packs/day:  0.75    Years: 47.00    Total pack years: 35.25    Types: Cigarettes   Smokeless tobacco: Never   Tobacco comments:    gave info about ZYban and patches  Vaping Use   Vaping Use: Never used  Substance Use Topics   Alcohol use: Yes    Alcohol/week: 0.0 standard drinks of alcohol    Comment: occasionally   Drug use: No     Medication list has been reviewed and updated.  Current Meds  Medication Sig   hydrochlorothiazide (HYDRODIURIL) 12.5 MG  tablet Take 1 tablet (12.5 mg total) by mouth daily.   losartan (COZAAR) 50 MG tablet Take 1 tablet (50 mg total) by mouth daily.       04/14/2022    9:44 AM 10/24/2021    9:02 AM 09/30/2021   10:07 AM 03/18/2021    8:03 AM  GAD 7 : Generalized Anxiety Score  Nervous, Anxious, on Edge 0 0 0 0  Control/stop worrying 0 0 0 0  Worry too much - different things 0 0 0 0  Trouble relaxing 0 0 0 0  Restless 0 0 0 0  Easily annoyed or irritable 0 0 0 0  Afraid - awful might happen 0 0 0 0  Total GAD 7 Score 0 0 0 0  Anxiety Difficulty Not difficult at all Not difficult at all Not difficult at all        04/14/2022    9:43 AM 10/24/2021    9:02 AM 09/30/2021   10:06 AM  Depression screen PHQ 2/9  Decreased Interest 0 0 0  Down, Depressed, Hopeless 0 0 0  PHQ - 2 Score 0 0 0  Altered sleeping 0 0 0  Tired, decreased energy 0 0 1  Change in appetite 0 0 0  Feeling bad or failure about yourself  0 0 0  Trouble concentrating 0 0 0  Moving slowly or fidgety/restless 0 0 0  Suicidal thoughts 0 0 0  PHQ-9 Score 0 0 1  Difficult doing work/chores Not difficult at all Not difficult at all Not difficult at all    BP Readings from Last 3 Encounters:  04/14/22 118/60  10/24/21 138/89  10/21/21 127/82    Physical Exam Vitals and nursing note reviewed.  HENT:     Head: Normocephalic.     Right Ear: Tympanic membrane and external ear normal.     Left Ear: Tympanic membrane and external ear normal.     Nose: Nose normal.     Mouth/Throat:     Mouth: Mucous membranes are moist.  Eyes:     General: No scleral icterus.       Right eye: No discharge.        Left eye: No discharge.     Conjunctiva/sclera: Conjunctivae normal.     Pupils: Pupils are equal, round, and reactive to light.  Neck:     Thyroid: No thyromegaly.     Vascular: No JVD.     Trachea: No tracheal deviation.  Cardiovascular:     Rate and Rhythm: Normal rate and regular rhythm.     Heart sounds: Normal heart sounds.  No murmur heard.    No friction rub. No gallop.  Pulmonary:     Effort: No respiratory distress.     Breath sounds: Normal breath sounds. No wheezing, rhonchi or rales.  Abdominal:     General: Bowel sounds are normal.     Palpations: Abdomen is soft. There  is no mass.     Tenderness: There is no abdominal tenderness. There is no guarding or rebound.  Musculoskeletal:        General: No tenderness. Normal range of motion.     Cervical back: Normal range of motion and neck supple.  Lymphadenopathy:     Cervical: No cervical adenopathy.  Skin:    General: Skin is warm.     Findings: No erythema or rash.  Neurological:     Mental Status: He is alert and oriented to person, place, and time.     Cranial Nerves: No cranial nerve deficit.     Deep Tendon Reflexes: Reflexes are normal and symmetric.     Wt Readings from Last 3 Encounters:  04/14/22 155 lb (70.3 kg)  10/24/21 155 lb (70.3 kg)  10/21/21 160 lb (72.6 kg)    BP 118/60   Pulse 70   Ht '5\' 11"'$  (1.803 m)   Wt 155 lb (70.3 kg)   SpO2 95%   BMI 21.62 kg/m   Assessment and Plan:  1. Essential hypertension Chronic.  Controlled.  Stable.  Blood pressure today is 118/60.  Continue losartan 50 mg once a day and hydrochlorothiazide 12.5 mg once a day.  Will check CMP for electrolytes and GFR. - hydrochlorothiazide (HYDRODIURIL) 12.5 MG tablet; Take 1 tablet (12.5 mg total) by mouth daily.  Dispense: 90 tablet; Refill: 1 - losartan (COZAAR) 50 MG tablet; Take 1 tablet (50 mg total) by mouth daily.  Dispense: 90 tablet; Refill: 1 - Comprehensive Metabolic Panel (CMET)  2. Aortic atherosclerosis ([place]) As noted above with control of losartan and dietary and lipid management. - losartan (COZAAR) 50 MG tablet; Take 1 tablet (50 mg total) by mouth daily.  Dispense: 90 tablet; Refill: 1  3. Need for immunization against influenza Discussed and administered - Flu Vaccine QUAD High Dose(Fluad)    Otilio Miu, MD

## 2022-04-14 NOTE — Patient Instructions (Addendum)

## 2022-04-15 LAB — COMPREHENSIVE METABOLIC PANEL
ALT: 20 IU/L (ref 0–44)
AST: 15 IU/L (ref 0–40)
Albumin/Globulin Ratio: 1.8 (ref 1.2–2.2)
Albumin: 4.3 g/dL (ref 3.9–4.9)
Alkaline Phosphatase: 110 IU/L (ref 44–121)
BUN/Creatinine Ratio: 10 (ref 10–24)
BUN: 9 mg/dL (ref 8–27)
Bilirubin Total: 0.3 mg/dL (ref 0.0–1.2)
CO2: 23 mmol/L (ref 20–29)
Calcium: 9 mg/dL (ref 8.6–10.2)
Chloride: 105 mmol/L (ref 96–106)
Creatinine, Ser: 0.87 mg/dL (ref 0.76–1.27)
Globulin, Total: 2.4 g/dL (ref 1.5–4.5)
Glucose: 98 mg/dL (ref 70–99)
Potassium: 4.1 mmol/L (ref 3.5–5.2)
Sodium: 141 mmol/L (ref 134–144)
Total Protein: 6.7 g/dL (ref 6.0–8.5)
eGFR: 95 mL/min/{1.73_m2} (ref >59)

## 2022-05-30 ENCOUNTER — Inpatient Hospital Stay: Admission: RE | Admit: 2022-05-30 | Payer: BC Managed Care – PPO | Source: Ambulatory Visit

## 2022-06-02 ENCOUNTER — Ambulatory Visit: Payer: BC Managed Care – PPO

## 2022-10-04 ENCOUNTER — Other Ambulatory Visit: Payer: Self-pay | Admitting: Family Medicine

## 2022-10-04 DIAGNOSIS — I700 Atherosclerosis of aorta: Secondary | ICD-10-CM

## 2022-10-04 DIAGNOSIS — I10 Essential (primary) hypertension: Secondary | ICD-10-CM

## 2022-10-13 ENCOUNTER — Ambulatory Visit: Payer: BC Managed Care – PPO | Admitting: Family Medicine

## 2022-10-13 DIAGNOSIS — I10 Essential (primary) hypertension: Secondary | ICD-10-CM

## 2022-10-13 DIAGNOSIS — I700 Atherosclerosis of aorta: Secondary | ICD-10-CM

## 2022-10-20 ENCOUNTER — Encounter: Payer: Self-pay | Admitting: Family Medicine

## 2022-10-20 ENCOUNTER — Ambulatory Visit: Payer: 59 | Admitting: Family Medicine

## 2022-10-20 VITALS — BP 130/78 | HR 68 | Ht 71.0 in | Wt 150.0 lb

## 2022-10-20 DIAGNOSIS — E785 Hyperlipidemia, unspecified: Secondary | ICD-10-CM | POA: Diagnosis not present

## 2022-10-20 DIAGNOSIS — F17210 Nicotine dependence, cigarettes, uncomplicated: Secondary | ICD-10-CM | POA: Diagnosis not present

## 2022-10-20 DIAGNOSIS — I700 Atherosclerosis of aorta: Secondary | ICD-10-CM | POA: Diagnosis not present

## 2022-10-20 DIAGNOSIS — I10 Essential (primary) hypertension: Secondary | ICD-10-CM | POA: Diagnosis not present

## 2022-10-20 DIAGNOSIS — R351 Nocturia: Secondary | ICD-10-CM

## 2022-10-20 MED ORDER — HYDROCHLOROTHIAZIDE 12.5 MG PO TABS: 12.5000 mg | ORAL_TABLET | Freq: Every day | ORAL | 1 refills | Status: DC

## 2022-10-20 MED ORDER — LOSARTAN POTASSIUM 50 MG PO TABS: 50.0000 mg | ORAL_TABLET | Freq: Every day | ORAL | 1 refills | Status: DC

## 2022-10-20 NOTE — Progress Notes (Signed)
Date:  10/20/2022   Name:  Bruce Kelley   DOB:  January 29, 1956   MRN:  [id number]   Chief Complaint: Hypertension  Hypertension This is a chronic problem. The current episode started more than 1 year ago. The problem has been gradually improving since onset. The problem is controlled. Pertinent negatives include no anxiety, blurred vision, chest pain, headaches, orthopnea, palpitations, peripheral edema, PND, shortness of breath or sweats. There are no associated agents to hypertension. There are no known risk factors for coronary artery disease. Past treatments include ACE inhibitors. The current treatment provides moderate improvement. There are no compliance problems.  There is no history of CAD/MI, CVA, left ventricular hypertrophy, PVD or retinopathy. There is no history of chronic renal disease, a hypertension causing med or renovascular disease.    Lab Results  Component Value Date   NA 141 04/14/2022   K 4.1 04/14/2022   CO2 23 04/14/2022   GLUCOSE 98 04/14/2022   BUN 9 04/14/2022   CREATININE 0.87 04/14/2022   CALCIUM 9.0 04/14/2022   EGFR 95 04/14/2022   GFRNONAA 89 03/12/2020   Lab Results  Component Value Date   CHOL 151 10/24/2021   HDL 35 (L) 10/24/2021   LDLCALC 98 10/24/2021   TRIG 95 10/24/2021   Lab Results  Component Value Date   TSH 1.630 03/12/2020   No results found for: "HGBA1C" Lab Results  Component Value Date   WBC 11.9 (H) 09/10/2020   HGB 16.2 09/10/2020   HCT 46.9 09/10/2020   MCV 91 09/10/2020   PLT 282 09/10/2020   Lab Results  Component Value Date   ALT 20 04/14/2022   AST 15 04/14/2022   ALKPHOS 110 04/14/2022   BILITOT 0.3 04/14/2022   No results found for: "25OHVITD2", "25OHVITD3", "VD25OH"   Review of Systems  Constitutional:  Positive for fatigue. Negative for unexpected weight change.  HENT:  Negative for congestion and trouble swallowing.   Eyes:  Negative for blurred vision and visual disturbance.  Respiratory:   Negative for cough, chest tightness, shortness of breath, wheezing and stridor.   Cardiovascular:  Negative for chest pain, palpitations, orthopnea and PND.  Gastrointestinal:  Negative for abdominal pain, blood in stool and constipation.  Endocrine: Negative for polydipsia and polyuria.  Genitourinary:  Negative for difficulty urinating.  Musculoskeletal:  Negative for arthralgias and gait problem.  Neurological:  Negative for headaches.  Hematological:  Negative for adenopathy. Does not bruise/bleed easily.    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   Torticollis 08/09/2014   Acid reflux 08/09/2014    Allergies  Allergen Reactions   Meloxicam     Hives/ rash   Aspirin Rash    Past Surgical History:  Procedure Laterality Date   APPENDECTOMY     COLONOSCOPY WITH PROPOFOL N/A 09/30/2017   Procedure: COLONOSCOPY WITH PROPOFOL;  Surgeon: Toney Reil, MD;  Location: [facility];  Service: Endoscopy;  Laterality: N/A;   COLONOSCOPY WITH PROPOFOL N/A 10/21/2021   Procedure: COLONOSCOPY WITH PROPOFOL;  Surgeon: Toney Reil, MD;  Location: [facility];  Service: Gastroenterology;  Laterality: N/A;    Social History   Tobacco Use   Smoking status: Every Day    Current packs/day: 0.75  Average packs/day: 0.8 packs/day for 47.0 years (35.3 ttl pk-yrs)    Types: Cigarettes   Smokeless tobacco: Never   Tobacco comments:    gave info about ZYban and patches  Vaping Use   Vaping status: Never Used  Substance Use Topics   Alcohol use: Yes    Alcohol/week: 0.0 standard drinks of alcohol    Comment: occasionally   Drug use: No     Medication list has been reviewed and updated.  Current Meds  Medication Sig   hydrochlorothiazide  (HYDRODIURIL) 12.5 MG tablet Take 1 tablet by mouth once daily   losartan (COZAAR) 50 MG tablet Take 1 tablet by mouth once daily       10/20/2022    8:03 AM 04/14/2022    9:44 AM 10/24/2021    9:02 AM 09/30/2021   10:07 AM  GAD 7 : Generalized Anxiety Score  Nervous, Anxious, on Edge 0 0 0 0  Control/stop worrying 0 0 0 0  Worry too much - different things 0 0 0 0  Trouble relaxing 0 0 0 0  Restless 0 0 0 0  Easily annoyed or irritable 0 0 0 0  Afraid - awful might happen 0 0 0 0  Total GAD 7 Score 0 0 0 0  Anxiety Difficulty Not difficult at all Not difficult at all Not difficult at all Not difficult at all       10/20/2022    8:02 AM 04/14/2022    9:43 AM 10/24/2021    9:02 AM  Depression screen PHQ 2/9  Decreased Interest 0 0 0  Down, Depressed, Hopeless 0 0 0  PHQ - 2 Score 0 0 0  Altered sleeping 0 0 0  Tired, decreased energy 0 0 0  Change in appetite 0 0 0  Feeling bad or failure about yourself  0 0 0  Trouble concentrating 0 0 0  Moving slowly or fidgety/restless 0 0 0  Suicidal thoughts 0 0 0  PHQ-9 Score 0 0 0  Difficult doing work/chores Not difficult at all Not difficult at all Not difficult at all    BP Readings from Last 3 Encounters:  10/20/22 130/78  04/14/22 118/60  10/24/21 138/89    Physical Exam Vitals and nursing note reviewed.  HENT:     Head: Normocephalic.     Right Ear: Tympanic membrane, ear canal and external ear normal.     Left Ear: Tympanic membrane, ear canal and external ear normal.     Nose: Nose normal. No congestion or rhinorrhea.     Mouth/Throat:     Mouth: Mucous membranes are moist.     Pharynx: No oropharyngeal exudate or posterior oropharyngeal erythema.  Eyes:     General: No scleral icterus.       Right eye: No discharge.        Left eye: No discharge.     Conjunctiva/sclera: Conjunctivae normal.     Pupils: Pupils are equal, round, and reactive to light.  Neck:     Thyroid: No thyromegaly.     Vascular: No carotid  bruit or JVD.     Trachea: No tracheal deviation.  Cardiovascular:     Rate and Rhythm: Normal rate and regular rhythm.     Chest Wall: PMI is not displaced.     Pulses: Normal pulses.     Heart sounds: Normal heart sounds, S1 normal and S2 normal. No murmur heard.    No systolic murmur is present.  No diastolic murmur is present.     No friction rub. No gallop. No S3 or S4 sounds.  Pulmonary:     Effort: No respiratory distress.     Breath sounds: Normal breath sounds. No wheezing, rhonchi or rales.  Chest:     Chest wall: No tenderness.  Abdominal:     General: Bowel sounds are normal.     Palpations: Abdomen is soft. There is no mass.     Tenderness: There is no abdominal tenderness. There is no guarding or rebound.  Genitourinary:    Prostate: Normal. Not enlarged, not tender and no nodules present.     Rectum: Normal. Guaiac result negative. No mass.  Musculoskeletal:        General: No tenderness. Normal range of motion.     Cervical back: Normal range of motion and neck supple.     Right lower leg: No edema.     Left lower leg: No edema.  Lymphadenopathy:     Cervical: No cervical adenopathy.  Skin:    General: Skin is warm.     Findings: No rash.  Neurological:     Mental Status: He is alert.     Wt Readings from Last 3 Encounters:  10/20/22 150 lb (68 kg)  04/14/22 155 lb (70.3 kg)  10/24/21 155 lb (70.3 kg)    BP 130/78   Pulse 68   Ht 5\' 11"  (1.803 m)   Wt 150 lb (68 kg)   SpO2 95%   BMI 20.92 kg/m   Assessment and Plan:  1. Essential hypertension Chronic.  Controlled.  Stable.  Blood pressure today is 130/78.  Asymptomatic.  Tolerating medications well.  Continue hydrochlorothiazide 12.5 mg once a day and losartan 50 mg once a day.  Will check renal function panel for electrolytes and GFR.  Will recheck patient in 6 months. - hydrochlorothiazide (HYDRODIURIL) 12.5 MG tablet; Take 1 tablet (12.5 mg total) by mouth daily.  Dispense: 90 tablet;  Refill: 1 - losartan (COZAAR) 50 MG tablet; Take 1 tablet (50 mg total) by mouth daily.  Dispense: 90 tablet; Refill: 1 - Renal Function Panel  2. Aortic atherosclerosis (HCC) Risk factors noted for atherosclerotic coronary artery disease and aortic atherosclerosis.  Low-dose CT of the chest noted coronary artery disease but patient is asymptomatic at this time.  We will continue to control his blood pressure as well as monitoring his lipid.  We have a course asked patient to quit smoking. - losartan (COZAAR) 50 MG tablet; Take 1 tablet (50 mg total) by mouth daily.  Dispense: 90 tablet; Refill: 1  3. Dyslipidemia .  Controlled.  Stable.  Mild decrease of HDL primary concern.  Encourage exercise will check lipid panel for current level and low-cholesterol low triglyceride guidelines provided. - Lipid Panel With LDL/HDL Ratio - Renal Function Panel  4. Cigarette nicotine dependence without complication Patient has been advised of the health risks of smoking and counseled concerning cessation of tobacco products. I spent over 3 minutes for discussion and to answer questions.   5. Nocturia Patient having some nocturia and urinary frequency.  DRE notes no abnormality with enlargement, change in consistency, or nodularity.  Will check PSA to complement DRE. - PSA   Low-dose CT scan of the chest which was not done in 2023 will be scheduled for this year. Elizabeth Sauer, MD

## 2022-10-20 NOTE — Patient Instructions (Signed)

## 2022-10-21 LAB — RENAL FUNCTION PANEL
Albumin: 4.2 g/dL (ref 3.9–4.9)
BUN/Creatinine Ratio: 14 (ref 10–24)
BUN: 13 mg/dL (ref 8–27)
CO2: 24 mmol/L (ref 20–29)
Calcium: 8.9 mg/dL (ref 8.6–10.2)
Chloride: 103 mmol/L (ref 96–106)
Creatinine, Ser: 0.92 mg/dL (ref 0.76–1.27)
Glucose: 107 mg/dL — ABNORMAL HIGH (ref 70–99)
Phosphorus: 3.8 mg/dL (ref 2.8–4.1)
Potassium: 3.7 mmol/L (ref 3.5–5.2)
Sodium: 142 mmol/L (ref 134–144)
eGFR: 92 mL/min/{1.73_m2} (ref >59)

## 2022-10-21 LAB — LIPID PANEL WITH LDL/HDL RATIO
Cholesterol, Total: 164 mg/dL (ref 100–199)
HDL: 36 mg/dL — ABNORMAL LOW (ref >39)
LDL Chol Calc (NIH): 104 mg/dL — ABNORMAL HIGH (ref 0–99)
LDL/HDL Ratio: 2.9 ratio (ref 0.0–3.6)
Triglycerides: 136 mg/dL (ref 0–149)
VLDL Cholesterol Cal: 24 mg/dL (ref 5–40)

## 2022-10-21 LAB — PSA: Prostate Specific Ag, Serum: 1.2 ng/mL (ref 0.0–4.0)

## 2022-10-27 ENCOUNTER — Ambulatory Visit
Admission: RE | Admit: 2022-10-27 | Discharge: 2022-10-27 | Disposition: A | Discharge status: Home / Self Care | Payer: 59 | Source: Ambulatory Visit | Attending: Acute Care | Admitting: Acute Care

## 2022-10-27 DIAGNOSIS — Z87891 Personal history of nicotine dependence: Secondary | ICD-10-CM | POA: Insufficient documentation

## 2022-10-27 DIAGNOSIS — Z122 Encounter for screening for malignant neoplasm of respiratory organs: Secondary | ICD-10-CM | POA: Diagnosis present

## 2022-10-27 DIAGNOSIS — F17210 Nicotine dependence, cigarettes, uncomplicated: Secondary | ICD-10-CM | POA: Insufficient documentation

## 2022-11-03 ENCOUNTER — Other Ambulatory Visit: Payer: Self-pay | Admitting: Acute Care

## 2022-11-03 DIAGNOSIS — Z122 Encounter for screening for malignant neoplasm of respiratory organs: Secondary | ICD-10-CM

## 2022-11-03 DIAGNOSIS — Z87891 Personal history of nicotine dependence: Secondary | ICD-10-CM

## 2022-11-03 DIAGNOSIS — F17210 Nicotine dependence, cigarettes, uncomplicated: Secondary | ICD-10-CM

## 2022-11-04 ENCOUNTER — Telehealth: Payer: Self-pay

## 2022-11-04 ENCOUNTER — Other Ambulatory Visit: Payer: Self-pay

## 2022-11-04 DIAGNOSIS — I700 Atherosclerosis of aorta: Secondary | ICD-10-CM | POA: Insufficient documentation

## 2022-11-04 DIAGNOSIS — I2510 Atherosclerotic heart disease of native coronary artery without angina pectoris: Secondary | ICD-10-CM

## 2022-11-04 MED ORDER — ROSUVASTATIN CALCIUM 5 MG PO TABS
5.0000 mg | ORAL_TABLET | Freq: Every day | ORAL | 0 refills | Status: DC
Start: 2022-11-04 — End: 2023-04-20

## 2022-11-04 NOTE — Telephone Encounter (Signed)
Called pt with CT results- CAD and aortic atherosclerosis- he will start cestor 5 mg- sent in to [facility]

## 2023-04-20 ENCOUNTER — Encounter: Payer: Self-pay | Admitting: Family Medicine

## 2023-04-20 ENCOUNTER — Ambulatory Visit: Payer: 59 | Admitting: Family Medicine

## 2023-04-20 VITALS — BP 120/70 | HR 76 | Ht 71.0 in | Wt 148.6 lb

## 2023-04-20 DIAGNOSIS — I700 Atherosclerosis of aorta: Secondary | ICD-10-CM | POA: Diagnosis not present

## 2023-04-20 DIAGNOSIS — I10 Essential (primary) hypertension: Secondary | ICD-10-CM | POA: Diagnosis not present

## 2023-04-20 DIAGNOSIS — J0100 Acute maxillary sinusitis, unspecified: Secondary | ICD-10-CM

## 2023-04-20 MED ORDER — AMOXICILLIN 500 MG PO CAPS: 500.0000 mg | ORAL_CAPSULE | Freq: Three times a day (TID) | ORAL | 0 refills | Status: AC

## 2023-04-20 MED ORDER — HYDROCHLOROTHIAZIDE 12.5 MG PO TABS
12.5000 mg | ORAL_TABLET | Freq: Every day | ORAL | 1 refills | Status: AC
Start: 2023-04-20 — End: ?

## 2023-04-20 MED ORDER — LOSARTAN POTASSIUM 50 MG PO TABS
50.0000 mg | ORAL_TABLET | Freq: Every day | ORAL | 1 refills | Status: AC
Start: 2023-04-20 — End: ?

## 2023-04-20 NOTE — Progress Notes (Signed)
 Date:  04/20/2023   Name:  Bruce Kelley   DOB:  1955-06-23   MRN:  [id number]   Chief Complaint: Hyperlipidemia and Hypertension  Hyperlipidemia This is a chronic problem. The current episode started more than 1 year ago. The problem is controlled. Recent lipid tests were reviewed and are normal. He has no history of chronic renal disease, diabetes, hypothyroidism, liver disease, obesity or nephrotic syndrome. There are no known factors aggravating his hyperlipidemia. Pertinent negatives include no chest pain, focal sensory loss, focal weakness, leg pain, myalgias or shortness of breath. Current antihyperlipidemic treatment includes diet change. There are no compliance problems.   Hypertension This is a chronic problem. The current episode started more than 1 year ago. The problem has been gradually improving since onset. The problem is controlled. Pertinent negatives include no blurred vision, chest pain, headaches, neck pain, orthopnea, palpitations, peripheral edema, shortness of breath or sweats. There are no associated agents to hypertension. Past treatments include ACE inhibitors. There is no history of chronic renal disease.  Sinusitis This is a chronic problem. The current episode started more than 1 year ago. The problem has been gradually improving since onset. Pertinent negatives include no chills, congestion, coughing, diaphoresis, ear pain, headaches, neck pain, shortness of breath or sore throat. Past treatments include nothing.    Lab Results  Component Value Date   NA 142 10/20/2022   K 3.7 10/20/2022   CO2 24 10/20/2022   GLUCOSE 107 (H) 10/20/2022   BUN 13 10/20/2022   CREATININE 0.92 10/20/2022   CALCIUM  8.9 10/20/2022   EGFR 92 10/20/2022   GFRNONAA 89 03/12/2020   Lab Results  Component Value Date   CHOL 164 10/20/2022   HDL 36 (L) 10/20/2022   LDLCALC 104 (H) 10/20/2022   TRIG 136 10/20/2022   Lab Results  Component Value Date   TSH 1.630 03/12/2020    No results found for: HGBA1C Lab Results  Component Value Date   WBC 11.9 (H) 09/10/2020   HGB 16.2 09/10/2020   HCT 46.9 09/10/2020   MCV 91 09/10/2020   PLT 282 09/10/2020   Lab Results  Component Value Date   ALT 20 04/14/2022   AST 15 04/14/2022   ALKPHOS 110 04/14/2022   BILITOT 0.3 04/14/2022   No results found for: 25OHVITD2, 25OHVITD3, VD25OH   Review of Systems  Constitutional:  Negative for chills, diaphoresis and fever.  HENT:  Negative for congestion, drooling, ear discharge, ear pain and sore throat.   Eyes:  Negative for blurred vision.  Respiratory:  Negative for cough, shortness of breath and wheezing.   Cardiovascular:  Negative for chest pain, palpitations, orthopnea and leg swelling.  Gastrointestinal:  Negative for abdominal pain, blood in stool, constipation, diarrhea and nausea.  Endocrine: Negative for polydipsia.  Genitourinary:  Negative for dysuria, frequency, hematuria and urgency.  Musculoskeletal:  Negative for back pain, myalgias and neck pain.  Skin:  Negative for rash.  Allergic/Immunologic: Negative for environmental allergies.  Neurological:  Negative for dizziness, focal weakness and headaches.  Hematological:  Does not bruise/bleed easily.  Psychiatric/Behavioral:  Negative for suicidal ideas. The patient is not nervous/anxious.     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  Torticollis 08/09/2014   Acid reflux 08/09/2014    Allergies  Allergen Reactions   Meloxicam      Hives/ rash   Aspirin Rash    Past Surgical History:  Procedure Laterality Date   APPENDECTOMY     COLONOSCOPY WITH PROPOFOL  N/A 09/30/2017   Procedure: COLONOSCOPY WITH PROPOFOL ;   Surgeon: Unk Corinn Skiff, MD;  Location: [facility];  Service: Endoscopy;  Laterality: N/A;   COLONOSCOPY WITH PROPOFOL  N/A 10/21/2021   Procedure: COLONOSCOPY WITH PROPOFOL ;  Surgeon: Unk Corinn Skiff, MD;  Location: [facility] END[facility];  Service: Gastroenterology;  Laterality: N/A;    Social History   Tobacco Use   Smoking status: Every Day    Current packs/day: 0.75    Average packs/day: 0.8 packs/day for 47.0 years (35.3 ttl pk-yrs)    Types: Cigarettes   Smokeless tobacco: Never   Tobacco comments:    gave info about ZYban and patches  Vaping Use   Vaping status: Never Used  Substance Use Topics   Alcohol use: Yes    Alcohol/week: 0.0 standard drinks of alcohol    Comment: occasionally   Drug use: No     Medication list has been reviewed and updated.  Current Meds  Medication Sig   hydrochlorothiazide  (HYDRODIURIL ) 12.5 MG tablet Take 1 tablet (12.5 mg total) by mouth daily.   losartan  (COZAAR ) 50 MG tablet Take 1 tablet (50 mg total) by mouth daily.       10/20/2022    8:03 AM 04/14/2022    9:44 AM 10/24/2021    9:02 AM 09/30/2021   10:07 AM  GAD 7 : Generalized Anxiety Score  Nervous, Anxious, on Edge 0 0 0 0  Control/stop worrying 0 0 0 0  Worry too much - different things 0 0 0 0  Trouble relaxing 0 0 0 0  Restless 0 0 0 0  Easily annoyed or irritable 0 0 0 0  Afraid - awful might happen 0 0 0 0  Total GAD 7 Score 0 0 0 0  Anxiety Difficulty Not difficult at all Not difficult at all Not difficult at all Not difficult at all       04/20/2023    8:22 AM 10/20/2022    8:02 AM 04/14/2022    9:43 AM  Depression screen PHQ 2/9  Decreased Interest 0 0 0  Down, Depressed, Hopeless 0 0 0  PHQ - 2 Score 0 0 0  Altered sleeping  0 0  Tired, decreased energy  0 0  Change in appetite  0 0  Feeling bad or failure about yourself   0 0  Trouble concentrating  0 0  Moving slowly or fidgety/restless  0 0  Suicidal thoughts  0 0  PHQ-9 Score  0 0  Difficult  doing work/chores  Not difficult at all Not difficult at all    BP Readings from Last 3 Encounters:  10/20/22 130/78  04/14/22 118/60  10/24/21 138/89    Physical Exam Vitals and nursing note reviewed.  Constitutional:      Appearance: He is well-developed.  HENT:     Head: Normocephalic and atraumatic.     Right Ear: Tympanic membrane, ear canal and external ear normal.     Left Ear: Tympanic membrane, ear canal and external ear normal.     Nose: Nose normal.     Right Turbinates: Swollen.     Left Turbinates: Swollen.     Right Sinus: No maxillary sinus tenderness or frontal sinus  tenderness.     Left Sinus: No maxillary sinus tenderness or frontal sinus tenderness.     Mouth/Throat:     Mouth: Mucous membranes are moist.     Dentition: Normal dentition.  Eyes:     General: Lids are normal. No scleral icterus.    Conjunctiva/sclera: Conjunctivae normal.     Pupils: Pupils are equal, round, and reactive to light.  Neck:     Thyroid: No thyromegaly.     Vascular: No carotid bruit, hepatojugular reflux or JVD.     Trachea: No tracheal deviation.  Cardiovascular:     Rate and Rhythm: Normal rate and regular rhythm.     Pulses: Normal pulses.     Heart sounds: Normal heart sounds. No murmur heard.    No friction rub. No gallop.  Pulmonary:     Effort: Pulmonary effort is normal.     Breath sounds: Normal breath sounds. No wheezing, rhonchi or rales.  Abdominal:     General: Bowel sounds are normal.     Palpations: Abdomen is soft. There is no hepatomegaly, splenomegaly or mass.     Tenderness: There is no abdominal tenderness. There is no rebound.     Hernia: There is no hernia in the left inguinal area.  Musculoskeletal:        General: Normal range of motion.     Cervical back: Normal range of motion and neck supple.  Lymphadenopathy:     Cervical: No cervical adenopathy.  Skin:    General: Skin is warm and dry.     Findings: No rash.  Neurological:     Mental  Status: He is alert and oriented to person, place, and time.     Sensory: No sensory deficit.     Deep Tendon Reflexes: Reflexes are normal and symmetric.  Psychiatric:        Mood and Affect: Mood is not anxious or depressed.     Wt Readings from Last 3 Encounters:  10/20/22 150 lb (68 kg)  04/14/22 155 lb (70.3 kg)  10/24/21 155 lb (70.3 kg)    Ht 5' 11 (1.803 m)   BMI 20.92 kg/m   Assessment and Plan:  1. Essential hypertension (Primary) Chronic.  Controlled.  Stable.  Blood pressure 120/70.  Asymptomatic.  Tolerating medication well.  Continue hydrochlorothiazide  12.5 mg once a day and losartan  50 mg once a day.  Review review of previous labs necessitate renal function panel for GFR and electrolytes.  Will recheck patient in 6 months. - Renal Function Panel - hydrochlorothiazide  (HYDRODIURIL ) 12.5 MG tablet; Take 1 tablet (12.5 mg total) by mouth daily.  Dispense: 90 tablet; Refill: 1 - losartan  (COZAAR ) 50 MG tablet; Take 1 tablet (50 mg total) by mouth daily.  Dispense: 90 tablet; Refill: 1  2. Aortic atherosclerosis (HCC) Patient with history of aortic atherosclerosis controlling with cholesterol as well with blood pressure control. - losartan  (COZAAR ) 50 MG tablet; Take 1 tablet (50 mg total) by mouth daily.  Dispense: 90 tablet; Refill: 1  3. Acute maxillary sinusitis, recurrence not specified New onset.  Persistent.  Stable.  History and examination consistent with sinusitis and we will initiate amoxicillin  500 mg 3 times a day for 10 days.   Cathryne Molt, MD

## 2023-04-20 NOTE — Patient Instructions (Addendum)

## 2023-04-21 ENCOUNTER — Encounter: Payer: Self-pay | Admitting: Family Medicine

## 2023-04-21 LAB — RENAL FUNCTION PANEL
Albumin: 4.3 g/dL (ref 3.9–4.9)
BUN/Creatinine Ratio: 15 (ref 10–24)
BUN: 13 mg/dL (ref 8–27)
CO2: 22 mmol/L (ref 20–29)
Calcium: 9.2 mg/dL (ref 8.6–10.2)
Chloride: 105 mmol/L (ref 96–106)
Creatinine, Ser: 0.84 mg/dL (ref 0.76–1.27)
Glucose: 100 mg/dL — ABNORMAL HIGH (ref 70–99)
Phosphorus: 3.4 mg/dL (ref 2.8–4.1)
Potassium: 4.4 mmol/L (ref 3.5–5.2)
Sodium: 143 mmol/L (ref 134–144)
eGFR: 96 mL/min/{1.73_m2} (ref >59)

## 2023-08-24 ENCOUNTER — Ambulatory Visit: Payer: Self-pay | Admitting: Family Medicine

## 2024-04-06 ENCOUNTER — Ambulatory Visit
Admission: RE | Admit: 2024-04-06 | Discharge: 2024-04-06 | Disposition: A | Discharge status: Home / Self Care | Source: Ambulatory Visit | Attending: Family Medicine | Admitting: Family Medicine

## 2024-04-06 DIAGNOSIS — F0781 Postconcussional syndrome: Secondary | ICD-10-CM | POA: Diagnosis present

## 2024-04-06 DIAGNOSIS — S0990XA Unspecified injury of head, initial encounter: Secondary | ICD-10-CM | POA: Diagnosis present
